# Patient Record
Sex: Female | Born: 1985
Health system: Southern US, Community
[De-identification: ages and names within clinical notes are randomized; demographics above are authoritative.]

## PROBLEM LIST (undated history)

## (undated) DIAGNOSIS — R42 Dizziness and giddiness: Secondary | ICD-10-CM

## (undated) DIAGNOSIS — F419 Anxiety disorder, unspecified: Secondary | ICD-10-CM

## (undated) DIAGNOSIS — Z8759 Personal history of other complications of pregnancy, childbirth and the puerperium: Secondary | ICD-10-CM

## (undated) DIAGNOSIS — Z8619 Personal history of other infectious and parasitic diseases: Secondary | ICD-10-CM

## (undated) DIAGNOSIS — O321XX0 Maternal care for breech presentation, not applicable or unspecified: Secondary | ICD-10-CM

## (undated) DIAGNOSIS — K219 Gastro-esophageal reflux disease without esophagitis: Secondary | ICD-10-CM

## (undated) DIAGNOSIS — I10 Essential (primary) hypertension: Secondary | ICD-10-CM

## (undated) DIAGNOSIS — R000 Tachycardia, unspecified: Secondary | ICD-10-CM

## (undated) HISTORY — DX: Tachycardia, unspecified: R00.0

## (undated) HISTORY — DX: Dizziness and giddiness: R42

## (undated) HISTORY — DX: Anxiety disorder, unspecified: F41.9

## (undated) HISTORY — DX: Personal history of other complications of pregnancy, childbirth and the puerperium: Z87.59

## (undated) HISTORY — DX: Personal history of other infectious and parasitic diseases: Z86.19

## (undated) HISTORY — PX: TONSILLECTOMY: SUR1361

## (undated) HISTORY — DX: Essential (primary) hypertension: I10

---
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
## 2009-04-30 ENCOUNTER — Ambulatory Visit: Payer: Self-pay | Admitting: Family Medicine

## 2009-04-30 DIAGNOSIS — J209 Acute bronchitis, unspecified: Secondary | ICD-10-CM

## 2009-07-17 ENCOUNTER — Ambulatory Visit: Payer: Self-pay | Admitting: Family Medicine

## 2009-07-17 DIAGNOSIS — S40019A Contusion of unspecified shoulder, initial encounter: Secondary | ICD-10-CM

## 2010-01-09 ENCOUNTER — Ambulatory Visit: Payer: Self-pay | Admitting: Family Medicine

## 2010-01-09 DIAGNOSIS — M542 Cervicalgia: Secondary | ICD-10-CM | POA: Insufficient documentation

## 2010-01-10 ENCOUNTER — Encounter: Payer: Self-pay | Admitting: Family Medicine

## 2010-02-19 ENCOUNTER — Ambulatory Visit: Payer: Self-pay | Admitting: Family Medicine

## 2010-02-19 LAB — CONVERTED CEMR LAB: Rapid Strep: NEGATIVE

## 2010-02-20 ENCOUNTER — Encounter: Payer: Self-pay | Admitting: Family Medicine

## 2010-03-11 ENCOUNTER — Ambulatory Visit: Payer: Self-pay | Admitting: Family Medicine

## 2010-03-12 ENCOUNTER — Encounter: Payer: Self-pay | Admitting: Family Medicine

## 2010-03-12 ENCOUNTER — Telehealth (INDEPENDENT_AMBULATORY_CARE_PROVIDER_SITE_OTHER): Payer: Self-pay | Admitting: *Deleted

## 2010-03-13 ENCOUNTER — Ambulatory Visit: Payer: Self-pay | Admitting: Family Medicine

## 2010-04-04 ENCOUNTER — Other Ambulatory Visit: Admission: RE | Admit: 2010-04-04 | Discharge: 2010-04-04 | Payer: Self-pay | Admitting: Family Medicine

## 2010-04-04 ENCOUNTER — Ambulatory Visit: Payer: Self-pay | Admitting: Family Medicine

## 2010-07-11 ENCOUNTER — Emergency Department (HOSPITAL_COMMUNITY)
Admission: EM | Admit: 2010-07-11 | Discharge: 2010-07-11 | Payer: Self-pay | Source: Home / Self Care | Admitting: Emergency Medicine

## 2010-07-11 ENCOUNTER — Encounter: Payer: Self-pay | Admitting: Family Medicine

## 2010-07-11 LAB — BASIC METABOLIC PANEL
BUN: 10 mg/dL (ref 6–23)
Calcium: 9.8 mg/dL (ref 8.4–10.5)
GFR calc non Af Amer: >60 mL/min (ref >60)
Glucose, Bld: 116 mg/dL — ABNORMAL HIGH (ref 70–99)
Potassium: 3.9 mEq/L (ref 3.5–5.1)

## 2010-07-11 LAB — DIFFERENTIAL
Basophils Absolute: 0 10*3/uL (ref 0.0–0.1)
Basophils Relative: 0 % (ref 0–1)
Lymphocytes Relative: 29 % (ref 12–46)
Lymphs Abs: 2 10*3/uL (ref 0.7–4.0)
Monocytes Relative: 6 % (ref 3–12)

## 2010-07-11 LAB — CBC
HCT: 41.8 % (ref 36.0–46.0)
Hemoglobin: 14.2 g/dL (ref 12.0–15.0)
RBC: 4.82 MIL/uL (ref 3.87–5.11)
WBC: 7 10*3/uL (ref 4.0–10.5)

## 2010-07-11 LAB — POCT CARDIAC MARKERS
CKMB, poc: <1 ng/mL — ABNORMAL LOW (ref 1.0–8.0)
Myoglobin, poc: 25 ng/mL (ref 12–200)
Troponin i, poc: <0.05 ng/mL (ref 0.00–0.09)

## 2010-07-11 LAB — D-DIMER, QUANTITATIVE: D-Dimer, Quant: <0.22 ug/mL-FEU (ref 0.00–0.48)

## 2010-07-14 ENCOUNTER — Ambulatory Visit
Admission: RE | Admit: 2010-07-14 | Discharge: 2010-07-14 | Payer: Self-pay | Source: Home / Self Care | Attending: Family Medicine | Admitting: Family Medicine

## 2010-07-14 ENCOUNTER — Encounter: Payer: Self-pay | Admitting: Family Medicine

## 2010-07-14 DIAGNOSIS — R002 Palpitations: Secondary | ICD-10-CM | POA: Insufficient documentation

## 2010-07-15 NOTE — Assessment & Plan Note (Signed)
Summary: NOV: f/u bronchitis   Vital Signs:  Patient profile:   25 year old female Height:      61.5 inches Weight:      139 pounds Pulse rate:   89 / minute BP sitting:   125 / 70  (right arm) Cuff size:   regular  Vitals Entered By: Avon Gully CMA, Duncan Dull) (March 13, 2010 9:23 AM) CC: NP est care   CC:  NP est care.  History of Present Illness: Was sick since Tuesday and dx with bronchitis.  This is the 3rd year in a row with bornchitis. Always in teh fall. No Sxs of SOB, or wheezing as a child. Does have spring allergies.  Family hx of asthma.  She is feeling better on the zpack and the mucinex.  Dr. Cathren Harsh heard some wheezing on exam.   Habits & Providers  Alcohol-Tobacco-Diet     Alcohol drinks/day: <1     Tobacco Status: never  Exercise-Depression-Behavior     Does Patient Exercise: yes     Type of exercise: running     Exercise (avg: min/session): 30-60     Times/week: 3     STD Risk: never     Drug Use: never     Seat Belt Use: always  Current Medications (verified): 1)  Ortho Tri-Cyclen (28) 0.18/0.215/0.25 Mg-35 Mcg Tabs (Norgestim-Eth Estrad Triphasic) 2)  Azithromycin 250 Mg Tabs (Azithromycin) .... Two Tabs By Mouth On Day 1, Then 1 Tab Daily On Days 2 Through 5 3)  Benzonatate 200 Mg Caps (Benzonatate) .... One By Mouth Hs As Needed Cough  Allergies (verified): 1)  ! Codeine Phosphate (Codeine Phosphate)  Comments:  Nurse/Medical Assistant: The patient's medications and allergies were reviewed with the patient and were updated in the Medication and Allergy Lists. Avon Gully CMA, Duncan Dull) (March 13, 2010 9:23 AM)  Past History:  Past Medical History: Last updated: 03/11/2010 Unremarkable Bronchitis annually for past 4 years  Past Surgical History: Tonsillectomy Tympanostomy tubes.   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 No kids.  Never  Smoked Alcohol use-yes 1-2/week Regular exercise-yes Drug use-no 2 caffeinated drink per week.  STD Risk:  never Drug Use:  never Seat Belt Use:  always  Review of Systems       No fever/sweats/weakness, unexplained weight loss/gain.  No vison changes.  No difficulty hearing/ringing in ears, hay fever/allergies.  No chest pain/discomfort, palpitations.  No Br lump/nipple discharge.  No cough/wheeze.  No blood in BM, nausea/vomiting/diarrhea.  No nighttime urination, leaking urine, unusual vaginal bleeding, discharge (penis or vagina).  No muscle/joint pain. No rash, change in mole.  No HA, memory loss.  No anxiety, sleep d/o, depression.  No easy bruising/bleeding, unexplained lump   Physical Exam  General:  Well-developed,well-nourished,in no acute distress; alert,appropriate and cooperative throughout examination Head:  Normocephalic and atraumatic without obvious abnormalities. No apparent alopecia or balding. Eyes:  No corneal or conjunctival inflammation noted. EOMI. Perrla.  Ears:  External ear exam shows no significant lesions or deformities.  Otoscopic examination reveals clear canals, tympanic membranes are intact bilaterally without bulging, retraction, inflammation or discharge. Hearing is grossly normal bilaterally. Nose:  External nasal examination shows no deformity or inflammation. Mouth:  Oral mucosa and oropharynx without lesions or exudates.  Teeth in good repair. Neck:  No deformities, masses, or tenderness noted. Lungs:  Normal respiratory effort, chest expands symmetrically. Lungs are clear to auscultation, no  crackles or wheezes. Heart:  Normal rate and regular rhythm. S1 and S2 normal without gallop, murmur, click, rub or other extra sounds. Skin:  no rashes.   Cervical Nodes:  No lymphadenopathy noted Psych:  Cognition and judgment appear intact. Alert and cooperative with normal attention span and concentration. No apparent delusions, illusions,  hallucinations   Impression & Recommendations:  Problem # 1:  ACUTE BRONCHITIS (ICD-466.0) Assessment Improved Doing well , Much improved. still a dry cough. Complete ABX. Also consider testing for asthma. Can schedule spirometry when is over this acute illness. She is planning on getting her flu shot through work.  Her updated medication list for this problem includes:    Azithromycin 250 Mg Tabs (Azithromycin) .Marland Kitchen..Marland Kitchen Two tabs by mouth on day 1, then 1 tab daily on days 2 through 5    Benzonatate 200 Mg Caps (Benzonatate) ..... One by mouth hs as needed cough  Complete Medication List: 1)  Ortho Tri-cyclen (28) 0.18/0.215/0.25 Mg-35 Mcg Tabs (Norgestim-eth estrad triphasic) 2)  Azithromycin 250 Mg Tabs (Azithromycin) .... Two tabs by mouth on day 1, then 1 tab daily on days 2 through 5 3)  Benzonatate 200 Mg Caps (Benzonatate) .... One by mouth hs as needed cough  Other Orders: Admin 1st Vaccine (16109) Tdap => 24yrs IM (60454)   Admin 1st Vaccine (09811) Flu Vaccine 69yrs + (91478)  Patient Instructions: 1)  You were given your Tdap today. 2)  Can schedule spirometry anytime to test for asthma   Contraindications/Deferment of Procedures/Staging:    Test/Procedure: FLU VAX    Reason for deferment: patient declined     Immunizations Administered:  Tetanus Vaccine:    Vaccine Type: Tdap    Site: left deltoid    Mfr: GlaxoSmithKline    Dose: 0.5 ml    Route: IM    Given by: Sue Lush McCrimmon CMA, (AAMA)    Exp. Date: 04/03/2012    Lot #: [id number]    VIS given: 05/02/08 version given March 13, 2010.

## 2010-07-15 NOTE — Assessment & Plan Note (Signed)
Summary: PAP ONLY   Vital Signs:  Patient profile:   25 year old female Height:      61.5 inches Weight:      139 pounds Pulse rate:   85 / minute BP sitting:   132 / 93  (right arm) Cuff size:   regular  Vitals Entered By: Avon Gully CMA, Duncan Dull) (April 04, 2010 8:24 AM) CC: pap only   CC:  pap only.  History of Present Illness: Her for pap only. Her periods are very regular on OCPs.This time she had spotting in  mid-cycle which is unusual for her. on the same OCP for 7 yeas and not chnage in the color or shape or manufacturer of the pill. Denies any pelvic or abodominal pain or dischare. She is sexually active.   Current Medications (verified): 1)  Ortho Tri-Cyclen (28) 0.18/0.215/0.25 Mg-35 Mcg Tabs (Norgestim-Eth Estrad Triphasic)  Allergies (verified): 1)  ! Codeine Phosphate (Codeine Phosphate)  Comments:  Nurse/Medical Assistant: The patient's medications and allergies were reviewed with the patient and were updated in the Medication and Allergy Lists. Avon Gully CMA, Duncan Dull) (April 04, 2010 8:25 AM)  Physical Exam  Genitalia:  Normal introitus for age, no external lesions, no vaginal discharge, mucosa pink and moist, no vaginal or cervical lesions, no vaginal atrophy, no friaility or hemorrhage, normal uterus size and position, no adnexal masses or tenderness   Impression & Recommendations:  Problem # 1:  SCREENING FOR MALIGNANT NEOPLASM OF THE CERVIX (ICD-V76.2) Pap sent. STD testing done for GC and chlam as this can cause abnormal bleeding. Vaginitis screen added.If neg then continue pill into next cycle and see if occurs again. Also consider pregnancy but possiblity is very low/   Complete Medication List: 1)  Ortho Tri-cyclen (28) 0.18/0.215/0.25 Mg-35 Mcg Tabs (Norgestim-eth estrad triphasic)   Orders Added: 1)  Est. Patient Level III [54098]

## 2010-07-15 NOTE — Assessment & Plan Note (Signed)
Summary: SORE THROAT/TIRED/HEADACHES   Vital Signs:  Patient Profile:   25 Years Old Female CC:      Swollen throat, headaches, tired x 3 days Height:     61.5 inches Weight:      138 pounds O2 Sat:      100 % O2 treatment:    Room Air Temp:     98.9 degrees F oral Pulse rate:   76 / minute Pulse rhythm:   regular Resp:     12 per minute BP sitting:   141 / 92  (right arm) Cuff size:   regular  Vitals Entered By: Emilio Math (January 09, 2010 8:18 AM)                  Current Allergies (reviewed today): ! CODEINE PHOSPHATE (CODEINE PHOSPHATE)History of Present Illness Chief Complaint: Swollen throat, headaches, tired x 3 days History of Present Illness:  Subjective: Patient complains of vague soreness in neck when swallowing for about 3 days, but not sore in her throat.  She has also been fatigued. No cough No pleuritic pain No wheezing No nasal congestion No post-nasal drainage No sinus pain/pressure No itchy/red eyes No earache No hemoptysis No SOB No fever/chills No nausea No vomiting No abdominal pain No diarrhea No skin rashes No myalgias No headache    REVIEW OF SYSTEMS Constitutional Symptoms      Denies fever, chills, night sweats, weight loss, weight gain, and fatigue.  Eyes       Denies change in vision, eye pain, eye discharge, glasses, contact lenses, and eye surgery. Ear/Nose/Throat/Mouth       Denies hearing loss/aids, change in hearing, ear pain, ear discharge, dizziness, frequent runny nose, frequent nose bleeds, sinus problems, sore throat, hoarseness, and tooth pain or bleeding.      Comments: Swollen throat Respiratory       Denies dry cough, productive cough, wheezing, shortness of breath, asthma, bronchitis, and emphysema/COPD.  Cardiovascular       Denies murmurs, chest pain, and tires easily with exhertion.    Gastrointestinal       Denies stomach pain, nausea/vomiting, diarrhea, constipation, blood in bowel movements, and  indigestion. Genitourniary       Denies painful urination, kidney stones, and loss of urinary control. Neurological       Complains of headaches.      Denies paralysis, seizures, and fainting/blackouts. Musculoskeletal       Denies muscle pain, joint pain, joint stiffness, decreased range of motion, redness, swelling, muscle weakness, and gout.  Skin       Denies bruising, unusual mles/lumps or sores, and hair/skin or nail changes.  Psych       Denies mood changes, temper/anger issues, anxiety/stress, speech problems, depression, and sleep problems.  Past History:  Past Medical History: Reviewed history from 04/30/2009 and no changes required. Unremarkable  Past Surgical History: Reviewed history from 04/30/2009 and no changes required. Tonsillectomy  Family History: Reviewed history from 04/30/2009 and no changes required. Mother : healthy Father : healthy  Social History: Reviewed history from 07/17/2009 and no changes required. 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  Extraocular movement is intact.  Conjunctivae are not inflamed.  Ears:  Canals normal.  Tympanic membranes normal.   Nose:  No congestion; no sinus tenderness Pharynx:  Normal  Neck:  Supple.  No adenopathy is present.  No thyromegaly is present  Lungs:  Clear to auscultation.  Breath sounds are equal.  Heart:  Regular rate and rhythm without murmurs, rubs, or gallops.  Abdomen:  Nontender without masses or hepatosplenomegaly.  Bowel sounds are present.  No CVA or flank tenderness.  Rapid strep test negative  CBC:  Normal; WBC 6.1, Hgb 13.8, Normal diff Assessment New Problems: CERVICALGIA (ICD-723.1)  SUSPECT A VIRAL PRODROME SYMPTOM  Plan New Orders: Est. Patient Level III [99213] CBC w/Diff  [16109-60454] T-Culture, Throat [09811-91478] Planning Comments:   Throat culture pending Treat symptomatically for now:  Ibuprofen, echinacea     Diagnoses and expected course of recovery discussed and will return if not improved as expected or if the condition worsens. Patient and/or caregiver verbalized understanding.   Orders Added: 1)  Est. Patient Level III [29562] 2)  CBC w/Diff [13086-57846] 3)  T-Culture, Throat [96295-28413]

## 2010-07-15 NOTE — Assessment & Plan Note (Signed)
Summary: SHOULDER INJURY/KH   Vital Signs:  Patient Profile:   25 Years Old Female CC:      left shoulder pain Height:     61.5 inches Weight:      142 pounds O2 Sat:      100 % O2 treatment:    Room Air Temp:     97.5 degrees F oral Pulse rate:   84 / minute Pulse rhythm:   regular Resp:     18 per minute BP sitting:   146 / 89  (right arm)  Pt. in pain?   yes    Location:   left shoulder    Intensity:   6    Type:       sharp  Vitals Entered By: Lita Mains, RN                   Prior Medication List:  ORTHO TRI-CYCLEN (28) 0.18/0.215/0.25 MG-35 MCG TABS (NORGESTIM-ETH ESTRAD TRIPHASIC)  CEFDINIR 300 MG CAPS (CEFDINIR) 1 by mouth q12hr PREDNISONE 10 MG TABS (PREDNISONE) 2 PO BID for 2 days, then 1 BID for 2 days, then 1 daily for 2 days.  Take PC BENZONATATE 200 MG CAPS (BENZONATATE) One by mouth hs as needed cough   Updated Prior Medication List: ORTHO TRI-CYCLEN (28) 0.18/0.215/0.25 MG-35 MCG TABS (NORGESTIM-ETH ESTRAD TRIPHASIC)   Current Allergies (reviewed today): ! CODEINE PHOSPHATE (CODEINE PHOSPHATE) History of Present Illness History from: patient Chief Complaint: left shoulder pain History of Present Illness: Patient states that on Saturday 07/13/09 she was painting and tripped over some blankets on the floor falling into the doorframe falling with all of her weight into her left shoulder. The has limited ROM but no swelling. She c/o 6/10 sharp pain at rest and 9/10 with activitty. She has positive sensation and denies tingling in the left extremity.  Current Meds ORTHO TRI-CYCLEN (28) 0.18/0.215/0.25 MG-35 MCG TABS (NORGESTIM-ETH ESTRAD TRIPHASIC)  MOBIC 7.5 MG TABS (MELOXICAM) sig 1 by mouth q day do not take w/ motrin HYDROCODONE-ACETAMINOPHEN 5-325 MG TABS (HYDROCODONE-ACETAMINOPHEN) sig 1 by mouth q 6-8hrs as needed for break throuh pain  REVIEW OF SYSTEMS Constitutional Symptoms      Denies fever, chills, night sweats, weight loss, weight gain,  and fatigue.  Eyes       Denies change in vision, eye pain, eye discharge, glasses, contact lenses, and eye surgery. Ear/Nose/Throat/Mouth       Denies hearing loss/aids, change in hearing, ear pain, ear discharge, dizziness, frequent runny nose, frequent nose bleeds, sinus problems, sore throat, hoarseness, and tooth pain or bleeding.  Respiratory       Denies dry cough, productive cough, wheezing, shortness of breath, asthma, bronchitis, and emphysema/COPD.  Cardiovascular       Denies murmurs, chest pain, and tires easily with exhertion.    Gastrointestinal       Denies stomach pain, nausea/vomiting, diarrhea, constipation, blood in bowel movements, and indigestion. Genitourniary       Denies painful urination, kidney stones, and loss of urinary control. Neurological       Denies paralysis, seizures, and fainting/blackouts. Musculoskeletal       Complains of muscle pain, joint pain, and decreased range of motion.      Denies joint stiffness, redness, swelling, muscle weakness, and gout.      Comments: left shoulder Skin       Denies bruising, unusual mles/lumps or sores, and hair/skin or nail changes.  Psych       Denies  mood changes, temper/anger issues, anxiety/stress, speech problems, depression, and sleep problems.  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 Unremarkable  Past Surgical History: Reviewed history from 04/30/2009 and no changes required. Tonsillectomy  Family History: Reviewed history from 04/30/2009 and no changes required. Mother : healthy Father : healthy  Social History: Reviewed history from 04/30/2009  and no changes required. 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 Alcohol use-yes Regular exercise-yes Physical Exam General appearance: well developed, well nourished, mild to moderate distress Head: normocephalic, atraumatic Extremities: Tenderness over the anterior L shoulder  abl to raise her arm slightly above her head Skin: no obvious rashes or lesions MSE: oriented to time, place, and person no tenderness over the scapula area Assessment New Problems: CONTUSION OF SHOULDER REGION (ICD-923.00) MYALGIA/MYOSITIS (ICD-729.1) MUSCULOSKELETAL PAIN (ICD-781.99)  shoulder contusion  Patient Education: Patient and/or caregiver instructed in the following: rest, rest fluids and Tylenol.  Plan New Medications/Changes: HYDROCODONE-ACETAMINOPHEN 5-325 MG TABS (HYDROCODONE-ACETAMINOPHEN) sig 1 by mouth q 6-8hrs as needed for break throuh pain  #16 x 0, 07/17/2009, Hassan Rowan MD MOBIC 7.5 MG TABS (MELOXICAM) sig 1 by mouth q day do not take w/ motrin  #30 x 0, 07/17/2009, Hassan Rowan MD  New Orders: Est. Patient Level III [02725] T-DG Shoulder*L* [73030] Shoulder Immobilizer any size [L3670] Follow Up: Follow up in 2-3 days if no improvement, Follow up with Primary Physician  The patient and/or caregiver has been counseled thoroughly with regard to medications prescribed including dosage, schedule, interactions, rationale for use, and possible side effects and they verbalize understanding.  Diagnoses and expected course of recovery discussed and will return if not improved as expected or if the condition worsens. Patient and/or caregiver verbalized understanding.  Prescriptions: HYDROCODONE-ACETAMINOPHEN 5-325 MG TABS (HYDROCODONE-ACETAMINOPHEN) sig 1 by mouth q 6-8hrs as needed for break throuh pain  #16 x 0   Entered and Authorized by:   Hassan Rowan MD   Signed by:   Hassan Rowan MD on 07/17/2009   Method used:   Print then Give to Patient   RxID:   [id number] MOBIC 7.5 MG TABS  (MELOXICAM) sig 1 by mouth q day do not take w/ motrin  #30 x 0   Entered and Authorized by:   Hassan Rowan MD   Signed by:   Hassan Rowan MD on 07/17/2009   Method used:   Print then Give to Patient   RxID:   [id number]   Patient Instructions: 1)  Please schedule a follow-up appointment as needed. 2)  Please schedule an appointment with your primary doctor in : 3)  Recommended remaining out of work for next 24 hours not scheduled until 36 hours 4)  Use sling as needed

## 2010-07-15 NOTE — Assessment & Plan Note (Signed)
Summary: Fever, sorethroat, HA x 3 dys rm 5   Vital Signs:  Patient Profile:   25 Years Old Female CC:      Cold & URI symptoms Height:     61.5 inches Weight:      139 pounds O2 Sat:      98 % O2 treatment:    Room Air Temp:     99.2 degrees F oral Pulse rate:   88 / minute Pulse rhythm:   regular Resp:     16 per minute BP sitting:   126 / 87  (left arm) Cuff size:   large  Vitals Entered By: Areta Haber CMA (February 19, 2010 6:05 PM)                  Current Allergies: ! CODEINE PHOSPHATE (CODEINE PHOSPHATE)      History of Present Illness Chief Complaint: Cold & URI symptoms History of Present Illness:  Subjective: Patient complains of sore throat for 3 days. No cough No pleuritic pain No wheezing Minimal nasal congestion ? post-nasal drainage No sinus pain/pressure No itchy/red eyes No earache No hemoptysis No SOB No fever/chills, + sweats No nausea No vomiting No abdominal pain No diarrhea No skin rashes + fatigue + myalgias No headache Used OTC meds without relief   Current Problems: URI (ICD-465.9) CERVICALGIA (ICD-723.1) CONTUSION OF SHOULDER REGION (ICD-923.00) ACUTE BRONCHITIS (ICD-466.0)   Current Meds ORTHO TRI-CYCLEN (28) 0.18/0.215/0.25 MG-35 MCG TABS (NORGESTIM-ETH ESTRAD TRIPHASIC)  ADVIL 200 MG TABS (IBUPROFEN) 800 mgs ADVIL COLD & SINUS LIQUI-GELS 30-200 MG CAPS (PSEUDOEPHEDRINE-IBUPROFEN) as directed PREDNISONE 10 MG TABS (PREDNISONE) 2 PO today, then 2  BID for 2 days, then 1 two times a day for 2 days, then 1 daily for 2 days.  Take PC  REVIEW OF SYSTEMS Constitutional Symptoms       Complains of fever and fatigue.     Denies chills, night sweats, weight loss, and weight gain.  Eyes       Denies change in vision, eye pain, eye discharge, glasses, contact lenses, and eye surgery. Ear/Nose/Throat/Mouth       Complains of sore throat.      Denies hearing loss/aids, change in hearing, ear pain, ear discharge,  dizziness, frequent runny nose, frequent nose bleeds, sinus problems, hoarseness, and tooth pain or bleeding.      Comments: x 3 dys Respiratory       Denies dry cough, productive cough, wheezing, shortness of breath, asthma, bronchitis, and emphysema/COPD.  Cardiovascular       Denies murmurs, chest pain, and tires easily with exhertion.    Gastrointestinal       Denies stomach pain, nausea/vomiting, diarrhea, constipation, blood in bowel movements, and indigestion. Genitourniary       Denies painful urination, kidney stones, and loss of urinary control. Neurological       Complains of headaches.      Denies paralysis, seizures, and fainting/blackouts. Musculoskeletal       Complains of muscle pain and joint pain.      Denies joint stiffness, decreased range of motion, redness, swelling, muscle weakness, and gout.  Skin       Denies bruising, unusual mles/lumps or sores, and hair/skin or nail changes.  Psych       Denies mood changes, temper/anger issues, anxiety/stress, speech problems, depression, and sleep problems.  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  Extraocular movement is intact.  Conjunctivae are not inflamed.  Ears:  Canals normal.  Tympanic membranes normal.   Nose:  Normal septum.  Normal turbinates, mildly congested.   No sinus tenderness present.  Pharynx:  Mildly erythematous Neck:  Supple.  Tender anterior  nodes Lungs:  Clear to auscultation.  Breath sounds are equal.  Heart:  Regular rate and rhythm without murmurs, rubs, or gallops.  Abdomen:  Nontender without masses or hepatosplenomegaly.  Bowel sounds are present.  No CVA or flank tenderness.  Skin:  No rash Rapid strep test negative  Assessment New Problems: URI (ICD-465.9)  SUSPECT EARLY VIRAL URI   Plan New Medications/Changes: PREDNISONE 10 MG TABS (PREDNISONE) 2 PO today, then 2  BID for 2 days, then 1 two times a day for 2 days, then 1 daily for 2 days.  Take PC  #16 x 0, 02/19/2010, Donna Christen MD  New Orders: Rapid Strep [16109] Est. Patient Level III [99213] T-Culture, Throat [[id number]] Planning Comments:   Throat culture pending.  For now begin tapering course of prednisone.  Begin expectorant/decongestant if sinus congestion and cough develop.  Cough suppressant at bedtime. Follow-up with PCP if not improving.   The patient and/or caregiver has been counseled thoroughly with regard to medications prescribed including dosage, schedule, interactions, rationale for use, and possible side effects and they verbalize understanding.  Diagnoses and expected course of recovery discussed and will return if not improved as expected or if the condition worsens. Patient and/or caregiver verbalized understanding.  Prescriptions: PREDNISONE 10 MG TABS (PREDNISONE) 2 PO today, then 2  BID for 2 days, then 1 two times a day for 2 days, then 1 daily for 2 days.  Take PC  #16 x 0   Entered and Authorized by:   Donna Christen MD   Signed by:   Donna Christen MD on 02/19/2010   Method used:   Print then Give to Patient   RxID:   [id number]   Patient Instructions: 1)  If sinus congestion and cough develop, may use Mucinex D (guaifenesin with decongestant) twice daily with plenty of fluids. 2)   May use Afrin nasal spray (or generic oxymetazoline) twice daily for about 5 days for persistent sinus congestion.   Also recommend  using saline nasal spray several times daily and/or saline nasal irrigation. 3)  If cough develops at night, may take Delsym Cough Suppressant at bedtime. 4)  Followup with family doctor if not improving 7 to 10 days.  Orders Added: 1)  Rapid Strep [[id number]] 2)  Est. Patient Level III [78469] 3)  T-Culture, Throat [[id number]]  Laboratory Results  Date/Time Received: February 19, 2010 6:07 PM  Date/Time Reported: February 19, 2010 6:07 PM   Other Tests  Rapid Strep: negative  Kit Test Internal QC: Negative   (Normal Range: Negative)

## 2010-07-15 NOTE — Letter (Signed)
Summary: CONTROLLED MED PRESCRIPTION POLICY  CONTROLLED MED PRESCRIPTION POLICY   Imported By: Dannette Barbara 03/11/2010 14:02:42  _____________________________________________________________________  External Attachment:    Type:   Image     Comment:   External Document

## 2010-07-15 NOTE — Assessment & Plan Note (Signed)
Summary: POSSIBLE BROCHITIS/FEVER/SOB (5)   Vital Signs:  Patient Profile:   25 Years Old Female CC:      prosuctive cough, chest tightness, SOB, x 2 days Height:     61.5 inches Weight:      139 pounds O2 Sat:      100 % O2 treatment:    Room Air Temp:     98.9 degrees F oral Pulse rate:   92 / minute Resp:     14 per minute BP sitting:   145 / 92  (left arm) Cuff size:   regular  Vitals Entered By: Lajean Saver RN (March 11, 2010 8:27 AM)                  Updated Prior Medication List: ORTHO TRI-CYCLEN (28) 0.18/0.215/0.25 MG-35 MCG TABS (NORGESTIM-ETH ESTRAD TRIPHASIC)   Current Allergies (reviewed today): ! CODEINE PHOSPHATE (CODEINE PHOSPHATE)History of Present Illness Chief Complaint: prosuctive cough, chest tightness, SOB, x 2 days History of Present Illness:  Subjective: Patient complains of non-productive cough for 2 days with sensation of chest tightness. No sore throat No pleuritic pain No wheezing No nasal congestion ? post-nasal drainage No sinus pain/pressure No itchy/red eyes No earache No hemoptysis No SOB Low grade fever/chills No nausea No vomiting No abdominal pain No diarrhea No skin rashes + fatigue No myalgias + mild headache    REVIEW OF SYSTEMS Constitutional Symptoms       Complains of fever and fatigue.     Denies chills, night sweats, weight loss, and weight gain.  Eyes       Denies change in vision, eye pain, eye discharge, glasses, contact lenses, and eye surgery. Ear/Nose/Throat/Mouth       Complains of frequent runny nose and hoarseness.      Denies hearing loss/aids, change in hearing, ear pain, ear discharge, dizziness, frequent nose bleeds, sinus problems, sore throat, and tooth pain or bleeding.  Respiratory       Complains of productive cough, wheezing, and shortness of breath.      Denies dry cough, asthma, bronchitis, and emphysema/COPD.  Cardiovascular       Complains of chest pain.      Denies murmurs and  tires easily with exhertion.      Comments: tight   Gastrointestinal       Denies stomach pain, nausea/vomiting, diarrhea, constipation, blood in bowel movements, and indigestion. Genitourniary       Denies painful urination, kidney stones, and loss of urinary control. Neurological       Complains of headaches.      Denies paralysis, seizures, and fainting/blackouts. Musculoskeletal       Denies muscle pain, joint pain, joint stiffness, decreased range of motion, redness, swelling, muscle weakness, and gout.  Skin       Denies bruising, unusual mles/lumps or sores, and hair/skin or nail changes.  Psych       Denies mood changes, temper/anger issues, anxiety/stress, speech problems, depression, and sleep problems. Other Comments: symptoms started 2 days ago. Biggest complaint is chest tightness and SOB. Low grade fever.   Past History:  Past Medical History: Unremarkable Bronchitis annually for past 4 years  Past Surgical History: Reviewed history from 04/30/2009 and no changes required. 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  Extraocular movement is intact.  Conjunctivae are not inflamed.  Ears:  Canals normal.  Tympanic membranes normal.   Nose:  Normal septum.  Normal turbinates, mildly congested.   No sinus tenderness present.  Pharynx:  Normal  Neck:  Supple.  No adenopathy is present.  No thyromegaly is present  Lungs:  Clear to auscultation.  Breath sounds are equal.  No sternal tenderness Heart:  Regular rate and rhythm without murmurs, rubs, or gallops.  Abdomen:  Nontender without masses or hepatosplenomegaly.  Bowel sounds are present.  No CVA or flank  tenderness.  Extremities:  No edema.  Skin:  No rash CBC:  WBC 12.4, Hgb 13.0 Assessment New Problems: ACUTE BRONCHITIS (ICD-466.0)   Plan New Medications/Changes: BENZONATATE 200 MG CAPS (BENZONATATE) One by mouth hs as needed cough  #12 x 0, 03/11/2010, Donna Christen MD AZITHROMYCIN 250 MG TABS (AZITHROMYCIN) Two tabs by mouth on day 1, then 1 tab daily on days 2 through 5  #6 tabs x 0, 03/11/2010, Donna Christen MD  New Orders: Est. Patient Level III [16109] CBC w/Diff [[id number]] Planning Comments:   Begin Z-pack, expectorant daytime, cough suppressant at night.  Increase fluids. Follow-up with PCP if not improving.   The patient and/or caregiver has been counseled thoroughly with regard to medications prescribed including dosage, schedule, interactions, rationale for use, and possible side effects and they verbalize understanding.  Diagnoses and expected course of recovery discussed and will return if not improved as expected or if the condition worsens. Patient and/or caregiver verbalized understanding.  Prescriptions: BENZONATATE 200 MG CAPS (BENZONATATE) One by mouth hs as needed cough  #12 x 0   Entered and Authorized by:   Donna Christen MD   Signed by:   Donna Christen MD on 03/11/2010   Method used:   Print then Give to Patient   RxID:   [id number] AZITHROMYCIN 250 MG TABS (AZITHROMYCIN) Two tabs by mouth on day 1, then 1 tab daily on days 2 through 5  #6 tabs x 0   Entered and Authorized by:   Donna Christen MD   Signed by:   Donna Christen MD on 03/11/2010   Method used:   Print then Give to Patient   RxID:   [id number]   Patient Instructions: 1)  Use plain Mucinex daytime with plenty of fluids for cough. 2)  Follow-up with family doctor if not improved one week.  Orders Added: 1)  Est. Patient Level III [40102] 2)  CBC w/Diff [[id number]]

## 2010-07-15 NOTE — Progress Notes (Signed)
  Phone Note Outgoing Call Call back at [facility] Phone [phone]   Action Taken: Phone Call Completed Summary of Call: Left msg, hope she is feeling better if not please follow up with Dr Linford Arnold

## 2010-07-17 ENCOUNTER — Telehealth: Payer: Self-pay | Admitting: Family Medicine

## 2010-07-17 DIAGNOSIS — N949 Unspecified condition associated with female genital organs and menstrual cycle: Secondary | ICD-10-CM | POA: Insufficient documentation

## 2010-07-18 ENCOUNTER — Encounter: Payer: Self-pay | Admitting: Family Medicine

## 2010-07-23 NOTE — Progress Notes (Signed)
Summary: serum preg test  Phone Note Call from Patient   Caller: Patient Call For: Nani Gasser MD Summary of Call: pt would like to do a serum preg test because her BCP has been recalled. can you please put in an order Initial call taken by: Avon Gully CMA, Duncan Dull),  July 17, 2010 2:19 PM  New Problems: DELAYED MENSES (ICD-626.8)   New Problems: DELAYED MENSES (ICD-626.8)

## 2010-07-23 NOTE — Progress Notes (Signed)
Summary: [facility] recall  Phone Note Call from Patient Call back at Home Phone [phone]   Caller: Patient Call For: Nani Gasser MD Summary of Call: pt states her OCP was recalled.  Pt was seen by Dr. Linford Arnold on 1/30.  Had neg UPT in ER, but would like SPT. RC to pt.  LM to RC at home number. Initial call taken by: Francee Piccolo CMA Duncan Dull),  July 17, 2010 1:44 PM  Follow-up for Phone Call        [facility] from pt.  LMP about 3 weeks ago.  Would like SPT in lab downstairs.  Pt still having nausea. Follow-up by: Francee Piccolo CMA Duncan Dull),  July 17, 2010 1:52 PM

## 2010-07-23 NOTE — Assessment & Plan Note (Signed)
Summary: [facility] ER visit fu   Vital Signs:  Patient profile:   25 year old female Height:      61.5 inches Weight:      143 pounds Pulse rate:   77 / minute BP sitting:   141 / 89  (right arm) Cuff size:   regular  Vitals Entered By: Avon Gully CMA, Duncan Dull) (July 14, 2010 3:35 PM) CC: F/u ED visit, felt sob and heart started racing almost passed out at work friday   CC:  F/u ED visit and felt sob and heart started racing almost passed out at work friday.  History of Present Illness: F/u ED visit, felt sob and heart started racing almost passed out at work friday.  As was getting ready for work felt like couldn't get a deep breath in. Denies worrying or feeling stressed.  Felt lighthead on the way to work. Then at work heart was beating hard and flushed and could hear her heartbeating in her head.  Sat down and a co-worker checked her BP and it was SBP 180.  HR was 120-130s.  Then felt lightheaded and thought might pass out. Went to ED adn neg UPT, CXR and neg D-dimer. Then later that night had a simlar episodes that lasted about 10 minutes.  Home BP was in the 160s.  EKG was normal.  Neg orthostatics.  no family hx of thyroid problems. Has felt really tired over the weekend. This AM felt nauseated. LMP as was 2-3 weeks ago. Has had a mild HA.  No CP or pleuritc pain.   Current Medications (verified): 1)  Ortho Tri-Cyclen Lo 0.18/0.215/0.25 Mg-25 Mcg Tabs (Norgestim-Eth Estrad Triphasic) .... Take One Tablet By Mouth Once A Day  Allergies (verified): 1)  ! Codeine Phosphate (Codeine Phosphate)  Comments:  Nurse/Medical Assistant: The patient's medications and allergies were reviewed with the patient and were updated in the Medication and Allergy Lists. 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  Never Smoked Alcohol use-yes 1-2/week Regular exercise-yes Drug use-no 2 caffeinated drink per week.   Family History: Mother- htn and DM, MVP Asthma GM with MI  Physical Exam  General:  Well-developed,well-nourished,in no acute distress; alert,appropriate and cooperative throughout examination Head:  Normocephalic and atraumatic without obvious abnormalities. No apparent alopecia or balding. Eyes:  No corneal or conjunctival inflammation noted. EOMI. Perrla. Ears:  External ear exam shows no significant lesions or deformities.  Otoscopic examination reveals clear canals, tympanic membranes are intact bilaterally without bulging, retraction, inflammation or discharge. Hearing is grossly normal bilaterally. Nose:  External nasal examination shows no deformity or inflammation.  Mouth:  Oral mucosa and oropharynx without lesions or exudates.  Teeth in good repair. Mild cobblestoning in the post pharynx.  Neck:  No deformities, masses, or tenderness noted. Lungs:  Normal respiratory effort, chest expands symmetrically. Lungs are clear to auscultation, no crackles or wheezes. Heart:  Normal rate and regular rhythm. S1 and S2 normal without gallop, murmur, click, rub or other extra sounds. Abdomen:  Bowel sounds positive,abdomen soft and non-tender without masses, organomegaly or hernias noted. Pulses:  Radial 2+ bilat Neurologic:  Very brisk reflexes in teh UE and LE.  Skin:  no rashes.  Cervical Nodes:  No lymphadenopathy noted Psych:  Cognition and judgment appear intact. Alert and cooperative with normal attention span and concentration. No apparent delusions, illusions, hallucinations   Impression & Recommendations:  Problem # 1:  PALPITATIONS, OCCASIONAL (ICD-785.1) Ucnlear etiology. She was on telemetry while there so  this essentially rules out an arrythemia.  Per her labs no anemia, infection or electrolyte imbalance or dehdyration. her d-dimer is negative which mostly rules out PE.  Her EKG and telemetry were normal.  Her chest x-ray was normal and this rules out infection. We will check her thyroid to rule out hyperthyroid. She denies any recent weight loss Will set up for an echo as well for further evaluation.   consider other things like pheochromocytoma. did review the neck and labs from the emergency department. Orders: T-TSH [phone]) T-2-D Echo Complete ([id number])  Complete Medication List: 1)  Ortho Tri-cyclen Lo 0.18/0.215/0.25 Mg-25 Mcg Tabs (Norgestim-eth estrad triphasic) .... Take one tablet by mouth once a day  Patient Instructions: 1)  We will call you with the echo.   Orders Added: 1)  T-TSH [[id number]] 2)  T-2-D Echo Complete [93307] 3)  Est. Patient Level IV [40347]

## 2010-07-28 ENCOUNTER — Ambulatory Visit (HOSPITAL_COMMUNITY): Payer: 59 | Attending: Family Medicine

## 2010-08-06 NOTE — Letter (Signed)
Summary: [facility]   [facility]   Imported By: Kassie Mends 07/30/2010 10:07:28  _____________________________________________________________________  External Attachment:    Type:   Image     Comment:   External Document

## 2010-09-29 ENCOUNTER — Other Ambulatory Visit: Payer: Self-pay | Admitting: *Deleted

## 2010-09-29 MED ORDER — NORGESTIM-ETH ESTRAD TRIPHASIC 0.18/0.215/0.25 MG-25 MCG PO TABS: 1.0000 | ORAL_TABLET | ORAL | Status: DC

## 2011-03-18 LAB — OB RESULTS CONSOLE ABO/RH: RH Type: POSITIVE

## 2011-03-18 LAB — OB RESULTS CONSOLE RUBELLA ANTIBODY, IGM: Rubella: IMMUNE

## 2011-03-18 LAB — OB RESULTS CONSOLE HIV ANTIBODY (ROUTINE TESTING): HIV: NONREACTIVE

## 2011-03-18 LAB — OB RESULTS CONSOLE RPR: RPR: NONREACTIVE

## 2011-04-04 ENCOUNTER — Inpatient Hospital Stay (INDEPENDENT_AMBULATORY_CARE_PROVIDER_SITE_OTHER)
Admission: RE | Admit: 2011-04-04 | Discharge: 2011-04-04 | Disposition: A | Discharge status: Home / Self Care | Payer: 59 | Source: Ambulatory Visit | Attending: Emergency Medicine | Admitting: Emergency Medicine

## 2011-04-04 ENCOUNTER — Encounter: Payer: Self-pay | Admitting: Emergency Medicine

## 2011-04-04 DIAGNOSIS — J069 Acute upper respiratory infection, unspecified: Secondary | ICD-10-CM

## 2011-05-18 NOTE — Progress Notes (Signed)
Summary: SORE THROAT(RM5)   Vital Signs:  Patient Profile:   25 Years Old Female CC:      SORE THROAT Height:     61.5 inches Weight:      151.13 pounds O2 Sat:      100 % O2 treatment:    Room Air Temp:     98.4 degrees F oral Pulse rate:   92 / minute Resp:     20 per minute BP sitting:   129 / 89  (left arm) Cuff size:   regular  Vitals Entered By: Linton Flemings RN (April 04, 2011 1:54 PM)                  Updated Prior Medication List: PENATAL VITAMINS Current Allergies (reviewed today): ! CODEINE PHOSPHATE (CODEINE PHOSPHATE)History of Present Illness Chief Complaint: SORE THROAT History of Present Illness: 25 Years Old Female complains of onset of cold symptoms for 2 days.  She is [redacted] weeks pregnant with her first child. + sore throat No cough No pleuritic pain No wheezing No nasal congestion No post-nasal drainage No sinus pain/pressure No chest congestion No itchy/red eyes No earache No hemoptysis No SOB No chills/sweats No fever No nausea No vomiting No abdominal pain No diarrhea No skin rashes No fatigue No myalgias No headache   REVIEW OF SYSTEMS Constitutional Symptoms      Denies fever, chills, night sweats, weight loss, weight gain, and fatigue.  Eyes       Denies change in vision, eye pain, eye discharge, glasses, contact lenses, and eye surgery. Ear/Nose/Throat/Mouth       Complains of sore throat.      Denies hearing loss/aids, change in hearing, ear pain, ear discharge, dizziness, frequent runny nose, frequent nose bleeds, sinus problems, hoarseness, and tooth pain or bleeding.  Respiratory       Denies dry cough, productive cough, wheezing, shortness of breath, asthma, bronchitis, and emphysema/COPD.  Cardiovascular       Denies murmurs, chest pain, and tires easily with exhertion.    Gastrointestinal       Denies stomach pain, nausea/vomiting, diarrhea, constipation, blood in bowel movements, and indigestion. Genitourniary    Denies painful urination, kidney stones, and loss of urinary control. Neurological       Complains of headaches.      Denies paralysis, seizures, and fainting/blackouts. Musculoskeletal       Denies muscle pain, joint pain, joint stiffness, decreased range of motion, redness, swelling, muscle weakness, and gout.  Skin       Denies bruising, unusual mles/lumps or sores, and hair/skin or nail changes.  Psych       Denies mood changes, temper/anger issues, anxiety/stress, speech problems, depression, and sleep problems. Other Comments: SORE THROAT STARTED YESTERDAY   Past History:  Past Medical History: Reviewed history from 03/11/2010 and no changes required. Unremarkable Bronchitis annually for past 4 years  Past Surgical History: Reviewed history from 03/13/2010 and no changes required. Tonsillectomy Tympanostomy tubes.   Family History: Reviewed history from 07/14/2010 and no changes required. Mother- htn and DM, MVP Asthma GM with MI  Social History: Reviewed history from 03/13/2010 and no changes required. 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  Never Smoked Alcohol use-yes 1-2/week Regular exercise-yes Drug use-no 2 caffeinated drink per week.  Physical Exam General appearance: well developed, well nourished, no acute distress Ears: normal, no lesions or deformities Nasal: mucosa pink, nonedematous, no septal deviation, turbinates normal Oral/Pharynx: tongue normal, posterior pharynx  without erythema or exudate Chest/Lungs: no rales, wheezes, or rhonchi bilateral, breath sounds equal without effort Heart: regular rate and  rhythm, no murmur MSE: oriented to time, place, and person Assessment New Problems: UPPER RESPIRATORY INFECTION, ACUTE (ICD-465.9)   Plan New Orders: Est. Patient Level III [99213] T-Culture, Throat [[id number]] Rapid Strep [13086] Planning Comments:    No antibiotic given today.  Culture pending.  Rapid strep neg.   Discussed with her pregnancy class A, B, C, D, X and what meds are better.  However, I prefer zero medicines to be taken unless ok'd by OB since she is still in the prime time of organogenesis. Use nasal saline solution (over the counter) at least 3 times a day.  Call your OB on Monday to discuss.   The patient and/or caregiver has been counseled thoroughly with regard to medications prescribed including dosage, schedule, interactions, rationale for use, and possible side effects and they verbalize understanding.  Diagnoses and expected course of recovery discussed and will return if not improved as expected or if the condition worsens. Patient and/or caregiver verbalized understanding.   Orders Added: 1)  Est. Patient Level III [99213] 2)  T-Culture, Throat [[id number]] 3)  Rapid Strep [[id number]]    Laboratory Results  Date/Time Received: April 04, 2011 2:11 PM  Date/Time Reported: April 04, 2011 2:12 PM   Other Tests  Rapid Strep: negative  Kit Test Internal QC: Negative   (Normal Range: Negative)

## 2011-10-06 ENCOUNTER — Encounter (HOSPITAL_COMMUNITY): Payer: Self-pay | Admitting: Pharmacist

## 2011-10-07 ENCOUNTER — Encounter (HOSPITAL_COMMUNITY): Payer: Self-pay

## 2011-10-08 ENCOUNTER — Encounter (HOSPITAL_COMMUNITY): Payer: Self-pay

## 2011-10-08 ENCOUNTER — Encounter (HOSPITAL_COMMUNITY)
Admission: RE | Admit: 2011-10-08 | Discharge: 2011-10-08 | Disposition: A | Discharge status: Home / Self Care | Payer: 59 | Source: Ambulatory Visit | Attending: Obstetrics and Gynecology | Admitting: Obstetrics and Gynecology

## 2011-10-08 HISTORY — DX: Maternal care for breech presentation, not applicable or unspecified: O32.1XX0

## 2011-10-08 HISTORY — DX: Gastro-esophageal reflux disease without esophagitis: K21.9

## 2011-10-08 LAB — SURGICAL PCR SCREEN: Staphylococcus aureus: NEGATIVE

## 2011-10-08 NOTE — Patient Instructions (Addendum)
   Your procedure is scheduled on: Wednesday May 1st  Enter through the [employer] of [facility] at: 7:30am Pick up the phone at the desk and dial [phone] and inform us of your arrival.  Please call this number if you have any problems the morning of surgery: [phone]  Remember: Do not eat food after midnight: Tuesday Do not drink clear liquids after: midnight Tuesday Take these medicines the morning of surgery with a SIP OF WATER:zantac Do not wear jewelry, make-up, or FINGER nail polish Do not wear lotions, powders, perfumes or deodorant. Do not shave 48 hours prior to surgery. Do not bring valuables to the hospital. Contacts, dentures or bridgework may not be worn into surgery.  Leave suitcase in the car. After Surgery it may be brought to your room. For patients being admitted to the hospital, checkout time is 11:00am the day of discharge.   Remember to use your hibiclens as instructed.Please shower with 1/2 bottle the evening before your surgery and the other 1/2 bottle the morning of surgery. Neck down avoiding private area.

## 2011-10-09 ENCOUNTER — Other Ambulatory Visit: Payer: Self-pay | Admitting: Obstetrics and Gynecology

## 2011-10-13 MED ORDER — CEFAZOLIN SODIUM-DEXTROSE 2-3 GM-% IV SOLR
2.0000 g | INTRAVENOUS | Status: AC
  Administered 2011-10-14: 2 g via INTRAVENOUS
  Filled 2011-10-13: qty 50

## 2011-10-14 ENCOUNTER — Encounter (HOSPITAL_COMMUNITY): Payer: Self-pay | Admitting: *Deleted

## 2011-10-14 ENCOUNTER — Inpatient Hospital Stay (HOSPITAL_COMMUNITY)
Admission: RE | Admit: 2011-10-14 | Discharge: 2011-10-17 | DRG: 765 | Disposition: A | Discharge status: Home / Self Care | Payer: 59 | Source: Ambulatory Visit | Attending: Obstetrics and Gynecology | Admitting: Obstetrics and Gynecology

## 2011-10-14 ENCOUNTER — Inpatient Hospital Stay (HOSPITAL_COMMUNITY): Payer: 59 | Admitting: Anesthesiology

## 2011-10-14 ENCOUNTER — Encounter (HOSPITAL_COMMUNITY): Payer: Self-pay | Admitting: Anesthesiology

## 2011-10-14 ENCOUNTER — Encounter (HOSPITAL_COMMUNITY)
Admission: RE | Disposition: A | Discharge status: Home / Self Care | Payer: Self-pay | Source: Ambulatory Visit | Attending: Obstetrics and Gynecology

## 2011-10-14 DIAGNOSIS — O329XX0 Maternal care for malpresentation of fetus, unspecified, not applicable or unspecified: Secondary | ICD-10-CM | POA: Diagnosis present

## 2011-10-14 DIAGNOSIS — O139 Gestational [pregnancy-induced] hypertension without significant proteinuria, unspecified trimester: Secondary | ICD-10-CM | POA: Diagnosis present

## 2011-10-14 DIAGNOSIS — Z8759 Personal history of other complications of pregnancy, childbirth and the puerperium: Secondary | ICD-10-CM

## 2011-10-14 DIAGNOSIS — O9903 Anemia complicating the puerperium: Secondary | ICD-10-CM | POA: Diagnosis not present

## 2011-10-14 DIAGNOSIS — O321XX0 Maternal care for breech presentation, not applicable or unspecified: Principal | ICD-10-CM | POA: Diagnosis present

## 2011-10-14 DIAGNOSIS — O4100X0 Oligohydramnios, unspecified trimester, not applicable or unspecified: Secondary | ICD-10-CM | POA: Diagnosis present

## 2011-10-14 DIAGNOSIS — D62 Acute posthemorrhagic anemia: Secondary | ICD-10-CM | POA: Diagnosis not present

## 2011-10-14 HISTORY — DX: Personal history of other complications of pregnancy, childbirth and the puerperium: Z87.59

## 2011-10-14 SURGERY — Surgical Case
Anesthesia: Spinal | Wound class: Clean Contaminated

## 2011-10-14 MED ORDER — DIPHENHYDRAMINE HCL 50 MG/ML IJ SOLN: 12.5000 mg | INTRAMUSCULAR | Status: DC | PRN

## 2011-10-14 MED ORDER — MENTHOL 3 MG MT LOZG: 1.0000 | LOZENGE | OROMUCOSAL | Status: DC | PRN

## 2011-10-14 MED ORDER — LANOLIN HYDROUS EX OINT: 1.0000 "application " | TOPICAL_OINTMENT | CUTANEOUS | Status: DC | PRN

## 2011-10-14 MED ORDER — 0.9 % SODIUM CHLORIDE (POUR BTL) OPTIME
TOPICAL | Status: DC | PRN
  Administered 2011-10-14: 1000 mL

## 2011-10-14 MED ORDER — METHYLERGONOVINE MALEATE 0.2 MG PO TABS: 0.2000 mg | ORAL_TABLET | ORAL | Status: DC | PRN

## 2011-10-14 MED ORDER — METOCLOPRAMIDE HCL 5 MG/ML IJ SOLN: 10.0000 mg | Freq: Three times a day (TID) | INTRAMUSCULAR | Status: DC | PRN

## 2011-10-14 MED ORDER — BUPIVACAINE HCL (PF) 0.25 % IJ SOLN
INTRAMUSCULAR | Status: DC | PRN
  Administered 2011-10-14: 10 mL

## 2011-10-14 MED ORDER — DIBUCAINE 1 % RE OINT: 1.0000 "application " | TOPICAL_OINTMENT | RECTAL | Status: DC | PRN

## 2011-10-14 MED ORDER — TETANUS-DIPHTH-ACELL PERTUSSIS 5-2.5-18.5 LF-MCG/0.5 IM SUSP: 0.5000 mL | Freq: Once | INTRAMUSCULAR | Status: DC

## 2011-10-14 MED ORDER — DIPHENHYDRAMINE HCL 25 MG PO CAPS: 25.0000 mg | ORAL_CAPSULE | ORAL | Status: DC | PRN

## 2011-10-14 MED ORDER — DIPHENHYDRAMINE HCL 25 MG PO CAPS: 25.0000 mg | ORAL_CAPSULE | Freq: Four times a day (QID) | ORAL | Status: DC | PRN

## 2011-10-14 MED ORDER — NALBUPHINE HCL 10 MG/ML IJ SOLN
5.0000 mg | INTRAMUSCULAR | Status: DC | PRN
  Filled 2011-10-14: qty 1

## 2011-10-14 MED ORDER — WITCH HAZEL-GLYCERIN EX PADS: 1.0000 "application " | MEDICATED_PAD | CUTANEOUS | Status: DC | PRN

## 2011-10-14 MED ORDER — OXYCODONE-ACETAMINOPHEN 5-325 MG PO TABS
1.0000 | ORAL_TABLET | ORAL | Status: DC | PRN
  Administered 2011-10-14 – 2011-10-17 (×13): 1 via ORAL
  Filled 2011-10-14 (×13): qty 1

## 2011-10-14 MED ORDER — ONDANSETRON HCL 4 MG/2ML IJ SOLN: 4.0000 mg | Freq: Three times a day (TID) | INTRAMUSCULAR | Status: DC | PRN

## 2011-10-14 MED ORDER — OXYTOCIN 20 UNITS IN LACTATED RINGERS INFUSION - SIMPLE
INTRAVENOUS | Status: AC
  Administered 2011-10-14: 125 mL/h via INTRAVENOUS
  Filled 2011-10-14: qty 1000

## 2011-10-14 MED ORDER — LACTATED RINGERS IV SOLN
INTRAVENOUS | Status: DC
  Administered 2011-10-14 (×3): via INTRAVENOUS

## 2011-10-14 MED ORDER — MORPHINE SULFATE (PF) 0.5 MG/ML IJ SOLN
INTRAMUSCULAR | Status: DC | PRN
  Administered 2011-10-14: .1 mg via INTRATHECAL

## 2011-10-14 MED ORDER — ONDANSETRON HCL 4 MG/2ML IJ SOLN
INTRAMUSCULAR | Status: AC
  Filled 2011-10-14: qty 2

## 2011-10-14 MED ORDER — BUPIVACAINE HCL (PF) 0.25 % IJ SOLN
INTRAMUSCULAR | Status: AC
  Filled 2011-10-14: qty 30

## 2011-10-14 MED ORDER — FENTANYL CITRATE 0.05 MG/ML IJ SOLN
INTRAMUSCULAR | Status: AC
  Filled 2011-10-14: qty 2

## 2011-10-14 MED ORDER — FENTANYL CITRATE 0.05 MG/ML IJ SOLN
INTRAMUSCULAR | Status: DC | PRN
  Administered 2011-10-14: 15 ug via INTRATHECAL

## 2011-10-14 MED ORDER — ONDANSETRON HCL 4 MG/2ML IJ SOLN
INTRAMUSCULAR | Status: DC | PRN
  Administered 2011-10-14: 4 mg via INTRAVENOUS

## 2011-10-14 MED ORDER — BUPIVACAINE IN DEXTROSE 0.75-8.25 % IT SOLN
INTRATHECAL | Status: DC | PRN
  Administered 2011-10-14: 11.75 mg via INTRATHECAL

## 2011-10-14 MED ORDER — MEPERIDINE HCL 25 MG/ML IJ SOLN: 6.2500 mg | INTRAMUSCULAR | Status: DC | PRN

## 2011-10-14 MED ORDER — HYDROMORPHONE HCL PF 1 MG/ML IJ SOLN: 0.2500 mg | INTRAMUSCULAR | Status: DC | PRN

## 2011-10-14 MED ORDER — SCOPOLAMINE 1 MG/3DAYS TD PT72
MEDICATED_PATCH | TRANSDERMAL | Status: AC
  Administered 2011-10-14: 1.5 mg via TRANSDERMAL
  Filled 2011-10-14: qty 1

## 2011-10-14 MED ORDER — KETOROLAC TROMETHAMINE 60 MG/2ML IM SOLN
INTRAMUSCULAR | Status: AC
  Administered 2011-10-14: 60 mg via INTRAMUSCULAR
  Filled 2011-10-14: qty 2

## 2011-10-14 MED ORDER — ONDANSETRON HCL 4 MG PO TABS: 4.0000 mg | ORAL_TABLET | ORAL | Status: DC | PRN

## 2011-10-14 MED ORDER — METHYLERGONOVINE MALEATE 0.2 MG/ML IJ SOLN
0.2000 mg | INTRAMUSCULAR | Status: DC | PRN
Start: 2011-10-14 — End: 2011-10-15

## 2011-10-14 MED ORDER — SIMETHICONE 80 MG PO CHEW: 80.0000 mg | CHEWABLE_TABLET | ORAL | Status: DC | PRN

## 2011-10-14 MED ORDER — SIMETHICONE 80 MG PO CHEW
80.0000 mg | CHEWABLE_TABLET | Freq: Three times a day (TID) | ORAL | Status: DC
  Administered 2011-10-14 – 2011-10-17 (×11): 80 mg via ORAL

## 2011-10-14 MED ORDER — CEFAZOLIN SODIUM 1-5 GM-% IV SOLN
INTRAVENOUS | Status: AC
  Filled 2011-10-14: qty 50

## 2011-10-14 MED ORDER — NALOXONE HCL 0.4 MG/ML IJ SOLN: 0.4000 mg | INTRAMUSCULAR | Status: DC | PRN

## 2011-10-14 MED ORDER — DIPHENHYDRAMINE HCL 50 MG/ML IJ SOLN: 25.0000 mg | INTRAMUSCULAR | Status: DC | PRN

## 2011-10-14 MED ORDER — KETOROLAC TROMETHAMINE 60 MG/2ML IM SOLN
60.0000 mg | Freq: Once | INTRAMUSCULAR | Status: AC | PRN
  Administered 2011-10-14: 60 mg via INTRAMUSCULAR

## 2011-10-14 MED ORDER — LACTATED RINGERS IV SOLN
INTRAVENOUS | Status: DC
  Administered 2011-10-14: 19:00:00 via INTRAVENOUS

## 2011-10-14 MED ORDER — KETOROLAC TROMETHAMINE 30 MG/ML IJ SOLN
30.0000 mg | Freq: Four times a day (QID) | INTRAMUSCULAR | Status: DC | PRN
  Filled 2011-10-14: qty 1

## 2011-10-14 MED ORDER — SCOPOLAMINE 1 MG/3DAYS TD PT72
1.0000 | MEDICATED_PATCH | Freq: Once | TRANSDERMAL | Status: DC
  Filled 2011-10-14: qty 1

## 2011-10-14 MED ORDER — IBUPROFEN 600 MG PO TABS: 600.0000 mg | ORAL_TABLET | Freq: Four times a day (QID) | ORAL | Status: DC | PRN

## 2011-10-14 MED ORDER — ONDANSETRON HCL 4 MG/2ML IJ SOLN: 4.0000 mg | INTRAMUSCULAR | Status: DC | PRN

## 2011-10-14 MED ORDER — SCOPOLAMINE 1 MG/3DAYS TD PT72
1.0000 | MEDICATED_PATCH | Freq: Once | TRANSDERMAL | Status: DC
  Administered 2011-10-14: 1.5 mg via TRANSDERMAL

## 2011-10-14 MED ORDER — OXYTOCIN 20 UNITS IN LACTATED RINGERS INFUSION - SIMPLE
125.0000 mL/h | INTRAVENOUS | Status: AC
  Administered 2011-10-14: 125 mL/h via INTRAVENOUS

## 2011-10-14 MED ORDER — PHENYLEPHRINE HCL 10 MG/ML IJ SOLN
INTRAMUSCULAR | Status: DC | PRN
  Administered 2011-10-14: 80 ug via INTRAVENOUS

## 2011-10-14 MED ORDER — SODIUM CHLORIDE 0.9 % IV SOLN: 1.0000 ug/kg/h | INTRAVENOUS | Status: DC | PRN

## 2011-10-14 MED ORDER — OXYTOCIN 20 UNITS IN LACTATED RINGERS INFUSION - SIMPLE
INTRAVENOUS | Status: DC | PRN
  Administered 2011-10-14: 20 [IU] via INTRAVENOUS

## 2011-10-14 MED ORDER — SODIUM CHLORIDE 0.9 % IJ SOLN: 3.0000 mL | INTRAMUSCULAR | Status: DC | PRN

## 2011-10-14 MED ORDER — KETOROLAC TROMETHAMINE 30 MG/ML IJ SOLN
30.0000 mg | Freq: Four times a day (QID) | INTRAMUSCULAR | Status: DC | PRN
  Administered 2011-10-14: 30 mg via INTRAVENOUS

## 2011-10-14 MED ORDER — PRENATAL MULTIVITAMIN CH
1.0000 | ORAL_TABLET | Freq: Every day | ORAL | Status: DC
  Administered 2011-10-15 – 2011-10-17 (×3): 1 via ORAL
  Filled 2011-10-14 (×3): qty 1

## 2011-10-14 MED ORDER — OXYTOCIN 10 UNIT/ML IJ SOLN
INTRAMUSCULAR | Status: AC
  Filled 2011-10-14: qty 2

## 2011-10-14 MED ORDER — MORPHINE SULFATE 0.5 MG/ML IJ SOLN
INTRAMUSCULAR | Status: AC
  Filled 2011-10-14: qty 10

## 2011-10-14 MED ORDER — ZOLPIDEM TARTRATE 5 MG PO TABS: 5.0000 mg | ORAL_TABLET | Freq: Every evening | ORAL | Status: DC | PRN

## 2011-10-14 MED ORDER — IBUPROFEN 600 MG PO TABS
600.0000 mg | ORAL_TABLET | Freq: Four times a day (QID) | ORAL | Status: DC
  Administered 2011-10-14 – 2011-10-17 (×11): 600 mg via ORAL
  Filled 2011-10-14 (×11): qty 1

## 2011-10-14 MED ORDER — SENNOSIDES-DOCUSATE SODIUM 8.6-50 MG PO TABS
2.0000 | ORAL_TABLET | Freq: Every day | ORAL | Status: DC
  Administered 2011-10-14 – 2011-10-16 (×3): 2 via ORAL

## 2011-10-14 SURGICAL SUPPLY — 36 items
CLOTH BEACON ORANGE TIMEOUT ST (SAFETY) ×2 IMPLANT
CONTAINER PREFILL 10% NBF 15ML (MISCELLANEOUS) IMPLANT
DRESSING TELFA 8X3 (GAUZE/BANDAGES/DRESSINGS) IMPLANT
DRSG COVADERM 4X10 (GAUZE/BANDAGES/DRESSINGS) ×2 IMPLANT
ELECT REM PT RETURN 9FT ADLT (ELECTROSURGICAL) ×2
ELECTRODE REM PT RTRN 9FT ADLT (ELECTROSURGICAL) ×1 IMPLANT
EXTRACTOR VACUUM M CUP 4 TUBE (SUCTIONS) IMPLANT
GAUZE SPONGE 4X4 12PLY STRL LF (GAUZE/BANDAGES/DRESSINGS) ×4 IMPLANT
GLOVE BIO SURGEON STRL SZ7 (GLOVE) ×2 IMPLANT
GLOVE BIO SURGEON STRL SZ7.5 (GLOVE) ×4 IMPLANT
GLOVE BIOGEL PI IND STRL 7.0 (GLOVE) ×1 IMPLANT
GLOVE BIOGEL PI INDICATOR 7.0 (GLOVE) ×1
GLOVE SURG SS PI 7.0 STRL IVOR (GLOVE) ×2 IMPLANT
GLOVE SURG SS PI 7.5 STRL IVOR (GLOVE) ×2 IMPLANT
GOWN PREVENTION PLUS LG XLONG (DISPOSABLE) ×4 IMPLANT
GOWN PREVENTION PLUS XLARGE (GOWN DISPOSABLE) ×2 IMPLANT
KIT ABG SYR 3ML LUER SLIP (SYRINGE) IMPLANT
NEEDLE HYPO 25X1 1.5 SAFETY (NEEDLE) ×2 IMPLANT
NEEDLE HYPO 25X5/8 SAFETYGLIDE (NEEDLE) IMPLANT
NS IRRIG 1000ML POUR BTL (IV SOLUTION) ×2 IMPLANT
PACK C SECTION WH (CUSTOM PROCEDURE TRAY) ×2 IMPLANT
PAD ABD 7.5X8 STRL (GAUZE/BANDAGES/DRESSINGS) ×2 IMPLANT
RETAINER VISCERAL (MISCELLANEOUS) ×2 IMPLANT
SLEEVE SCD COMPRESS KNEE MED (MISCELLANEOUS) IMPLANT
STAPLER VISISTAT 35W (STAPLE) ×2 IMPLANT
SUT MNCRL 0 VIOLET CTX 36 (SUTURE) ×2 IMPLANT
SUT MON AB 2-0 CT1 27 (SUTURE) ×2 IMPLANT
SUT MON AB-0 CT1 36 (SUTURE) ×4 IMPLANT
SUT MONOCRYL 0 CTX 36 (SUTURE) ×2
SUT PLAIN 0 NONE (SUTURE) IMPLANT
SUT PLAIN 2 0 XLH (SUTURE) IMPLANT
SYR CONTROL 10ML LL (SYRINGE) ×2 IMPLANT
TAPE CLOTH SURG 4X10 WHT LF (GAUZE/BANDAGES/DRESSINGS) ×2 IMPLANT
TOWEL OR 17X24 6PK STRL BLUE (TOWEL DISPOSABLE) ×4 IMPLANT
TRAY FOLEY CATH 14FR (SET/KITS/TRAYS/PACK) ×2 IMPLANT
WATER STERILE IRR 1000ML POUR (IV SOLUTION) ×2 IMPLANT

## 2011-10-14 NOTE — Transfer of Care (Signed)
Immediate Anesthesia Transfer of Care Note  Patient: Candice Robertson  Procedure(s) Performed: Procedure(s) (LRB): CESAREAN SECTION (N/A)  Patient Location: PACU  Anesthesia Type: Spinal  Level of Consciousness: awake, alert  and oriented  Airway & Oxygen Therapy: Patient Spontanous Breathing  Post-op Assessment: Report given to PACU RN and Post -op Vital signs reviewed and stable  Post vital signs: Reviewed and stable  Complications: No apparent anesthesia complications

## 2011-10-14 NOTE — Transfer of Care (Signed)
Immediate Anesthesia Transfer of Care Note  Patient: Candice Robertson  Procedure(s) Performed: Procedure(s) (LRB): CESAREAN SECTION (N/A)  Patient Location: PACU  Anesthesia Type: Spinal  Level of Consciousness: awake, alert  and oriented  Airway & Oxygen Therapy: Patient Spontanous Breathing  Post-op Assessment: Report given to PACU RN and Post -op Vital signs reviewed and stable  Post vital signs: Reviewed and stable  Complications: No apparent anesthesia complications 

## 2011-10-14 NOTE — H&P (Signed)
Candice Robertson, STINGER               ACCOUNT NO.:  [id number]  MEDICAL RECORD NO.:  [id number]  LOCATION:                                 FACILITY:  PHYSICIAN:  Lenoard Aden, M.D.DATE OF BIRTH:  Feb 11, 1986  DATE OF ADMISSION:  10/14/2011 DATE OF DISCHARGE:                             HISTORY & PHYSICAL   CHIEF COMPLAINT:  Breech at 39 weeks' for primary C-section.  She is a 26 year old white female, G1, P0, at 1 weeks' gestation who presents for primary C-section due to persistent breech presentation and borderline oligohydramnios.  She has allergies to CODEINE.  MEDICATIONS:  Prenatal vitamins and hydroxyzine.  She has family history of mental retardation, diabetes, colon cancer, anxiety disorder, heart disease, hypertension.  She is a nonsmoker, nondrinker.  She denies domestic or physical violence.  This is her first pregnancy.  Prenatal course was complicated by breech presentation and a nonspecific pruritic rash, unresponsive to prednisone and __________.  PHYSICAL EXAMINATION:  GENERAL:  A well-developed, well-nourished white female, in no acute distress. VITAL SIGNS:  Height 60 inches, weight of 180 pounds. HEENT:  Normal. NECK:  Supple.  Full range of motion. LUNGS:  Clear. HEART:  Regular. ABDOMEN:  Soft, gravid, nontender.  Estimated fetal weight __________ pounds.  Breech by __________. PELVIC:  Cervix is closed, 2 cm long, breech, -2. EXTREMITIES:  No cords. NEUROLOGIC:  Nonfocal. SKIN:  Intact.  IMPRESSION:  Breech presentation at 39 weeks with borderline amniotic fluid index for primary cesarean section.  PLAN:  Proceed with primary low segment transverse cesarean section. Risks of anesthesia, infection, bleeding, injury to abdominal organs were discussed.  Delayed versus immediate complications to include bowel and bladder injury were noted.  The patient acknowledges and wishes to proceed.     Lenoard Aden, M.D.     RJT/MEDQ  D:   10/13/2011  T:  10/14/2011  Job:  [id number]

## 2011-10-14 NOTE — Anesthesia Procedure Notes (Signed)
Spinal  Patient location during procedure: OR Start time: 10/14/2011 9:46 AM Staffing Performed by: anesthesiologist  Preanesthetic Checklist Completed: patient identified, site marked, surgical consent, pre-op evaluation, timeout performed, IV checked, risks and benefits discussed and monitors and equipment checked Spinal Block Patient position: sitting Prep: site prepped and draped and DuraPrep Patient monitoring: heart rate, cardiac monitor, continuous pulse ox and blood pressure Approach: midline Location: L3-4 Injection technique: single-shot Needle Needle type: Sprotte  Needle gauge: 24 G Needle length: 9 cm Assessment Sensory level: T4 Additional Notes Clear free flow CSF on first attempt.  No paresthesia.  Patient tolerated procedure well.  Jasmine December, MD

## 2011-10-14 NOTE — Progress Notes (Signed)
Patient ID: Candice Robertson, female   DOB: 1985-11-22, 26 y.o.   MRN: [id number] Patient seen and examined. Consent witnessed and signed. No changes noted. Update completed.  Breech confirmed. BP elevated but no s/s PEC. Will proceed as scheduled.

## 2011-10-14 NOTE — Anesthesia Preprocedure Evaluation (Signed)
Anesthesia Evaluation  Patient identified by MRN, date of birth, ID band Patient awake    Reviewed: Allergy & Precautions, H&P , Patient's Chart, lab work & pertinent test results  Airway Mallampati: II TM Distance: >3 FB Neck ROM: full    Dental No notable dental hx.    Pulmonary  breath sounds clear to auscultation  Pulmonary exam normal       Cardiovascular Exercise Tolerance: Good Rhythm:regular Rate:Normal     Neuro/Psych    GI/Hepatic GERD-  ,  Endo/Other    Renal/GU      Musculoskeletal   Abdominal   Peds  Hematology   Anesthesia Other Findings Steroids for PUPS  Reproductive/Obstetrics                           Anesthesia Physical Anesthesia Plan  ASA: II  Anesthesia Plan: Spinal   Post-op Pain Management:    Induction:   Airway Management Planned:   Additional Equipment:   Intra-op Plan:   Post-operative Plan:   Informed Consent: I have reviewed the patients History and Physical, chart, labs and discussed the procedure including the risks, benefits and alternatives for the proposed anesthesia with the patient or authorized representative who has indicated his/her understanding and acceptance.   Dental Advisory Given  Plan Discussed with: CRNA  Anesthesia Plan Comments: (Lab work confirmed with CRNA in room. Platelets okay. Discussed spinal anesthetic, and patient consents to the procedure:  included risk of possible headache,backache, failed block, allergic reaction, and nerve injury. This patient was asked if she had any questions or concerns before the procedure started. )        Anesthesia Quick Evaluation

## 2011-10-14 NOTE — Anesthesia Postprocedure Evaluation (Signed)
  Anesthesia Post-op Note  Patient: Candice Robertson  Procedure(s) Performed: Procedure(s) (LRB): CESAREAN SECTION (N/A)   Patient is awake, responsive, moving her legs, and has signs of resolution of her numbness. Pain and nausea are reasonably well controlled. Vital signs are stable and clinically acceptable. Oxygen saturation is clinically acceptable. There are no apparent anesthetic complications at this time. Patient is ready for discharge.

## 2011-10-14 NOTE — Op Note (Signed)
Cesarean Section Procedure Note  Indications: malpresentation: Homero Fellers Breech Gestational HTN Oligohydramnios  Pre-operative Diagnosis: 39 week 1 day pregnancy.  Post-operative Diagnosis: same  Surgeon: Lenoard Aden   Assistants: Renae Fickle, CNM  Anesthesia: Local anesthesia 0.25.% bupivacaine and Spinal anesthesia  ASA Class: 2  Procedure Details  The patient was seen in the Holding Room. The risks, benefits, complications, treatment options, and expected outcomes were discussed with the patient.  The patient concurred with the proposed plan, giving informed consent. The risks of anesthesia, infection, bleeding and possible injury to other organs discussed. Injury to bowel, bladder, or ureter with possible need for repair discussed. Possible need for transfusion with secondary risks of hepatitis or HIV acquisition discussed. Post operative complications to include but not limited to DVT, PE and Pneumonia noted. The site of surgery properly noted/marked. The patient was taken to Operating Room # 1, identified as Keliah Harned and the procedure verified as C-Section Delivery. A Time Out was held and the above information confirmed.  After induction of anesthesia, the patient was draped and prepped in the usual sterile manner. A Pfannenstiel incision was made and carried down through the subcutaneous tissue to the fascia. Fascial incision was made and extended transversely using Mayo scissors. The fascia was separated from the underlying rectus tissue superiorly and inferiorly. The peritoneum was identified and entered. Peritoneal incision was extended longitudinally. The utero-vesical peritoneal reflection was incised transversely and the bladder flap was bluntly freed from the lower uterine segment. A low transverse uterine incision(Kerr hysterotomy) was made. Delivered from [facility] breech (with standard maneuvers) presentation was a  female with Apgar scores of 8 at one minute and 9 at five minutes.  Bulb suctioning gently performed. Neonatal team in attendance.After the umbilical cord was clamped and cut cord blood was obtained for evaluation. The placenta was removed intact and appeared normal. The uterus was curetted with a dry lap pack. Good hemostasis was noted.The uterine outline, tubes and ovaries appeared normal. The uterine incision was closed with running locked sutures of 0 Monocryl x 2 layers. Hemostasis was observed. Lavage was carried out until clear.The parietal peritoneum was closed with a running 2-0 Monocryl suture. The fascia was then reapproximated with running sutures of 0 Monocryl. The skin was reapproximated with staples.  Instrument, sponge, and needle counts were correct prior the abdominal closure and at the conclusion of the case.   Findings: above  Estimated Blood Loss:  600         Drains: foley                 Specimens: placenta                 Complications:  None; patient tolerated the procedure well.         Disposition: PACU - hemodynamically stable.         Condition: stable  Attending Attestation: I performed the procedure.

## 2011-10-15 ENCOUNTER — Encounter (HOSPITAL_COMMUNITY): Payer: Self-pay | Admitting: Obstetrics and Gynecology

## 2011-10-15 LAB — CBC
Platelets: 148 10*3/uL — ABNORMAL LOW (ref 150–400)
RBC: 3.61 MIL/uL — ABNORMAL LOW (ref 3.87–5.11)
RDW: 14.1 % (ref 11.5–15.5)
WBC: 8.8 10*3/uL (ref 4.0–10.5)

## 2011-10-15 LAB — RPR: RPR Ser Ql: NONREACTIVE

## 2011-10-15 MED ORDER — POLYSACCHARIDE IRON COMPLEX 150 MG PO CAPS
150.0000 mg | ORAL_CAPSULE | Freq: Every day | ORAL | Status: DC
  Administered 2011-10-15 – 2011-10-17 (×3): 150 mg via ORAL
  Filled 2011-10-15 (×4): qty 1

## 2011-10-15 MED ORDER — DOCUSATE SODIUM 100 MG PO CAPS
100.0000 mg | ORAL_CAPSULE | Freq: Every day | ORAL | Status: DC
  Administered 2011-10-16 – 2011-10-17 (×2): 100 mg via ORAL
  Filled 2011-10-15 (×2): qty 1

## 2011-10-15 NOTE — Progress Notes (Signed)
POSTOPERATIVE DAY # 1 S/P cesarean section   S:         Reports feeling well / minimal pain - interested in early discharge             Tolerating po intake / no nausea / no vomiting / + flatus / no BM             Bleeding is light             Pain controlled withmotrin and percocet             Up ad lib / ambulatory / voiding QS without difficulty  Newborn breast feeding  / female   O:  A & O x 3 NAD             VS: Blood pressure 107/57, pulse 88, temperature 97.9 F (36.6 C), temperature source Oral, resp. rate 20, last menstrual period 01/14/2011, SpO2 97.00%, unknown if currently breastfeeding.  LABS: WBC/Hgb/Hct/Plts:  8.8/9.9/31.3/148 (05/02 0535)   I&O:    +1968  Lungs: Clear and unlabored  Heart: regular rate and rhythm / no mumurs  Abdomen: soft, non-tender, mildly distended, bowel sounds active             Fundus: firm, non-tender, U-1             Dressing intact without drainage            Perineum: no edema  Lochia: light  Extremities: no edema, no calf pain or tenderness  A:        POD # 1 S/P cesarean section - breech             Mild ABL anemia            Stable status - desires early discharge tomorrow  P:        Routine postoperative care              Iron and colace x 4 weeks             Anticipate discharge tomorrow     Marlinda Mike 10/15/2011, 9:29 AM

## 2011-10-16 NOTE — Progress Notes (Signed)
Patient ID: Candice Robertson, female   DOB: 1986/05/24, 25 y.o.   MRN: [id number] POD # 2  Subjective: Pt reports feeling well/ Pain controlled with ibuprofen and percocet Tolerating po/Voiding without problems/ No n/v/Flatus pos Activity: out of bed and ambulate Bleeding is spotting Newborn info:  Information for the patient's newborn:  Santina, Trillo [[id number]]  female Feeding: breast   Objective: VS: Blood pressure 116/75, pulse 100, temperature 98 F (36.7 C), temperature source Oral, resp. rate 18.    Physical Exam:  General: alert, cooperative and no distress CV: Regular rate and rhythm Resp: clear Abdomen: soft, nontender, normal bowel sounds Incision: well approximated, no edema or erythema; staples intact and clean and dry Uterine Fundus: firm, below umbilicus, nontender Perineum: not inspected Lochia: minimal Ext: edema +1 pedal edema and Homans sign is negative, no sign of DVT    A/P: POD # 2/ G1P1001/ S/P C/Section d/t breech Doing well Continue routine post op orders Anticipate discharge home in the am   Signed: Demetrius Revel, MSN, [facility] 10/16/2011, 11:29 AM

## 2011-10-17 MED ORDER — IBUPROFEN 600 MG PO TABS: 600.0000 mg | ORAL_TABLET | Freq: Four times a day (QID) | ORAL | Status: AC

## 2011-10-17 MED ORDER — OXYCODONE-ACETAMINOPHEN 5-325 MG PO TABS: 1.0000 | ORAL_TABLET | ORAL | Status: AC | PRN

## 2011-10-17 MED ORDER — POLYSACCHARIDE IRON COMPLEX 150 MG PO CAPS: 150.0000 mg | ORAL_CAPSULE | Freq: Every day | ORAL | Status: DC

## 2011-10-17 NOTE — Progress Notes (Signed)
Subjective: POD# 3 Information for the patient's newborn:  Candice Robertson, Candice Robertson [[id number]]  female   Reports feeling well, tired. Feeding: breast Patient reports tolerating PO.  Breast symptoms: none Pain controlled with Motrin and Percocet. Denies HA/SOB/C/P/N/V/dizziness. Flatus present, no BM. She reports vaginal bleeding as normal, without clots.  She is ambulating, urinating without difficult.     Objective:   VS:  Filed Vitals:   10/16/11 0555 10/16/11 1407 10/16/11 2159 10/17/11 0538  BP: 116/75 142/85 131/90 105/61  Pulse: 100 108 99 84  Temp: 98 F (36.7 C) 98 F (36.7 C) 97.8 F (36.6 C) 98.3 F (36.8 C)  TempSrc: Oral Oral Oral Oral  Resp: 18 20 18 19   SpO2:        No intake or output data in the 24 hours ending 10/17/11 1034      Basename 10/15/11 0535  WBC 8.8  HGB 9.9*  HCT 31.3*  PLT 148*     Blood type: --/--/O POS (05/01 1305)  Rubella: Immune (10/03 1349)     Physical Exam:  General: alert, cooperative and no distress CV: Regular rate and rhythm Resp: clear Abdomen: soft, nontender, normal bowel sounds Incision: clean, dry, intact and mild edema around incision site, staples intact Uterine Fundus: firm, below umbilicus, nontender Lochia: minimal Ext: edema trace and Homans sign is negative, no sign of DVT      Assessment/Plan: 26 y.o.   POD# 3.  s/p Cesarean Delivery.  Indications: breech                Active Problems:  Postpartum care C/S - breech (5/1)  Gestational hypertension - delivered  BP stable  Doing well, stable.    Routine post-op care Staples to be  removed prior to discharge and replaced with benzoin and steri strips. DC home w/ [facility] instructions.   Candice Robertson 10/17/2011, 10:34 AM

## 2011-10-17 NOTE — Discharge Summary (Signed)
POSTOPERATIVE DISCHARGE SUMMARY:  Patient ID: Candice Robertson MRN: [id number] DOB/AGE: 26-06-1985 26 y.o.  Admit date: 10/14/2011 Discharge date:  10/17/2011   Admission Diagnoses: 1. Breech at 39 weeks' for primary C-section   Discharge Diagnoses:  1. Term Pregnancy-delivered 2. Post operative day 3 stable  Prenatal history: G1P1001   EDC : 10/21/2011, by Last Menstrual Period  Prenatal care at [facility] since [redacted] weeks gestation  Prenatal course was complicated by breech  presentation and a nonspecific pruritic rash, unresponsive to prednisone  and hydroxyzine.  Prenatal Labs: ABO, Rh: O (10/03 1349)  Antibody: Negative (10/03 1349) Rubella: Immune (10/03 1349)  RPR: NON REACTIVE (05/02 0535)  HBsAg: Negative (10/03 1349)  HIV: Non-reactive (10/03 1349)  GBS:   negative 1 hr Glucola : 124   Medical / Surgical History :  Past medical history:  Past Medical History  Diagnosis Date  . Breech presentation   . GERD (gastroesophageal reflux disease)   . PP care - s/p 1C/S - breech 5/1 10/14/2011  . Gestational hypertension - delivered 10/14/2011    Past surgical history:  Past Surgical History  Procedure Date  . Tonsillectomy   . Cesarean section 10/14/2011    Procedure: CESAREAN SECTION;  Surgeon: Lenoard Aden, MD;  Location: [facility];  Service: Gynecology;  Laterality: N/A;  Primary EDD: 10/21/11    Family History: History reviewed. No pertinent family history.  Social History:  reports that she quit smoking about 2 years ago. She does not have any smokeless tobacco history on file. She reports that she does not drink alcohol or use illicit drugs.   Allergies: Codeine phosphate    Current Medications at time of admission:  Prescriptions prior to admission  Medication Sig Dispense Refill  . Prenatal Vit-Fe Fumarate-FA (PRENATAL MULTIVITAMIN) TABS Take 1 tablet by mouth daily.      . ranitidine (ZANTAC) 150 MG tablet Take 150 mg by mouth 2 (two) times  daily.      Marland Kitchen DISCONTD: predniSONE (STERAPRED UNI-PAK) 5 MG TABS Take 5-20 mg by mouth taper from 4 doses each day to 1 dose and stop. Will finish steroid taper before surgery         Admit labs: 10/08/2011 WBC 10.2 / HGB 12.5 / HCT 36.3 / PLTS 240   Intrapartum Course: na  Procedures: Cesarean section delivery of female newborn by Dr Billy Coast  See operative report for further details  Postoperative / postpartum course: uneventful  Physical Exam:  VSS: Blood pressure 105/61, pulse 84, temperature 98.3 F (36.8 C), temperature source Oral, resp. rate 19, last menstrual period 01/14/2011, SpO2 98.00%, unknown if currently breastfeeding.   LABS:  Basename 10/15/11 0535  WBC 8.8  HGB 9.9*  HCT 31.3*  PLT 148*    I&O:        Incision:  approximated with staples / no erythema / no ecchymosis / no drainage Staples:  removed prior to discharge and replaced with benzoin and steri strips.   Discharge Instructions:  Discharged Condition: good Activity: pelvic rest and weight lifting and driving restrictions x 2 weeks Diet: routine Medications:  Medication List  As of 10/17/2011 10:41 AM   STOP taking these medications         predniSONE 5 MG Tabs         TAKE these medications         ibuprofen 600 MG tablet   Commonly known as: ADVIL,MOTRIN   Take 1 tablet (600 mg total) by mouth  every 6 (six) hours.      iron polysaccharides 150 MG capsule   Commonly known as: NIFEREX   Take 1 capsule (150 mg total) by mouth daily.      oxyCODONE-acetaminophen 5-325 MG per tablet   Commonly known as: PERCOCET   Take 1-2 tablets by mouth every 3 (three) hours as needed (moderate - severe pain).      prenatal multivitamin Tabs   Take 1 tablet by mouth daily.      ranitidine 150 MG tablet   Commonly known as: ZANTAC   Take 150 mg by mouth 2 (two) times daily.           Condition: stable Postpartum Instructions: refer to practice specific booklet Discharge to:  home Disposition: Final discharge disposition not confirmed Follow up :  Follow-up Information    Follow up with Lenoard Aden, MD in 6 weeks.   Contact information:   [address] [place] [address] [phone]           Signed: Arlan Organ 10/17/2011, 10:41 AM

## 2011-10-17 NOTE — Discharge Instructions (Signed)
Breast Pumping Tips Pumping your breast milk is a good way to stimulate milk production and have a steady supply of breast milk for your infant. Pumping is most helpful during your infant's growth spurts, when involving dad or a family member, or when you are away. There are several types of pumps available. They can be purchased at a baby or maternity store. You can begin pumping soon after delivery, but some experts believe that you should wait about four weeks to give your infant a bottle. In general, the more you breastfeed or pump, the more milk you will have for your infant. It is also important to take good care of yourself. This will reduce stress and help your body to create a healthy supply of milk. Your caregiver or lactation consultant can give you the information and support you need in your efforts to breastfeed your infant. PUMPING BREAST MILK  Follow the tips below for successful breast pumping. Take care of yourself.  Drink enough water or fluids to keep urine clear or pale yellow. You may notice a thirsty feeling while breastfeeding. This is because your body needs more water to make breast milk. Keep a large water bottle handy. Make healthy drink choices such as unsweetened fruit juice, milk and water. Limit soda, coffee, and alcohol (wait 2 hours to feed or pump if you have an alcoholic drink.)   Eat a healthy, well-balanced diet rich in fruits, vegetables, and whole grains.   Exercise as recommended by your caregiver.   Get plenty of sleep. Sleep when your infant sleeps. Ask friends and family for help if you need time to nap or rest.   Do not smoke. Smoking can lower your milk supply and harm your infant. If you need help quitting, ask your caregiver for a program recommendation.   Ask your caregiver about birth control options. Birth control pills may lower your milk supply. You may be advised to use condoms or other forms of birth control.  Relax and pump Stimulating your  let-down reflex is the key to successful and effective pumping. This makes the milk in all parts of the breast flow more freely.   It is easier to pump breast milk (and breastfeed) while you are relaxed. Find techniques that work for you. Quiet private spaces, breast massage, soothing heat placed on the breast, music, and pictures or a tape recording of your infant may help you to relax and "let down" your milk. If you have difficulty with your let down, try smelling one of your infant's blankets or an item of clothing he or she has worn while you are pumping.   When pumping, place the special suction cup (flange) directly over the nipple. It may be uncomfortable and cause nipple damage if it is not placed properly or is the wrong size. Applying a small amount of purified or modified lanolin to your nipple and the areola may help increase your comfort level. Also, you can change the speed and suction of many electric pumps to your comfort level. Your caregiver or lactation consultant can help you with this.   If pumping continues to be painful, or you feel you are not getting very much milk when you pump, you may need a different type of pump. A lactation consultant can help you determine if this is the case.   If you are with your infant, feed him or her on demand and try pumping after each feeding. This will boost your production, even if milk   does not come out. You may not be able to pump much milk at first, but keep up the routine, and this will change.   If you are working or away from your infant for several hours, try pumping for about 15 minutes every 2 to 3 hours. Pump both breasts at the same time if you can.   If your infant has a formula feeding, make sure you pump your milk around the same time to maintain your supply.   Begin pumping breast milk a few weeks before you return to work. This will help you develop techniques that work for you and will be able to store extra milk.   Find a  source of breastfeeding information that works well for you.  TIPS FOR STORING BREAST MILK  Store breast milk in a sealable sterile bag, jar, or container provided with your pumping supplies.   Store milk in small amounts close to what your infant is drinking at each feeding.   Cool pumped milk in a refrigerator or cooler. Pumped milk can last at the back of the refrigerator for 3 to 8 days.   Place cooled milk at the back of the freezer for up to 3 months.   Thaw the milk in its container or bag in warm water up to 24 hours in advance. Do not use a microwave to thaw or heat milk. Do not refreeze the milk after it has been thawed.   Breast milk is safe to drink when left at room temperature (mid 70s or colder) for 4 to 8 hours. After that, throw it away.   Milk fat can separate and look funny. The color can vary slightly from day to day. This is normal. Always shake the milk before using it to mix the fat with the more watery portion.  SEEK MEDICAL CARE IF:   You are having trouble pumping or feeding your infant.   You are concerned that you are not making enough milk.   You have nipple pain, soreness, or redness.   You have other questions or concerns related to you or your infant.  Document Released: 11/19/2009 Document Revised: 05/21/2011 Document Reviewed: 11/19/2009 [employer] Patient Information 2012 [employer].Postpartum Depression and Baby Blues The postpartum period begins right after the birth of a baby. During this time, there is often a great amount of joy and excitement. It is also a time of considerable changes in the life of the parent(s). Regardless of how many times a mother gives birth, each child brings new challenges and dynamics to the family. It is not unusual to have feelings of excitement accompanied by confusing shifts in moods, emotions, and thoughts. All mothers are at risk of developing postpartum depression or the "baby blues." These mood changes can occur  right after giving birth, or they may occur many months after giving birth. The baby blues or postpartum depression can be mild or severe. Additionally, postpartum depression can resolve rather quickly, or it can be a long-term condition. CAUSES Elevated hormones and their rapid decline are thought to be a main cause of postpartum depression and the baby blues. There are a number of hormones that radically change during and after pregnancy. Estrogen and progesterone usually decrease immediately after delivering your baby. The level of thyroid hormone and various cortisol steroids also rapidly drop. Other factors that play a major role in these changes include major life events and genetics.  RISK FACTORS If you have any of the following risks for the   baby blues or postpartum depression, know what symptoms to watch out for during the postpartum period. Risk factors that may increase the likelihood of getting the baby blues or postpartum depression include:  Havinga personal or family history of depression.   Having depression while being pregnant.   Having premenstrual or oral contraceptive-associated mood issues.   Having exceptional life stress.   Having marital conflict.   Lacking a social support network.   Having a baby with special needs.   Having health problems such as diabetes.  SYMPTOMS Baby blues symptoms include:  Brief fluctuations in mood, such as going from extreme happiness to sadness.   Decreased concentration.   Difficulty sleeping.   Crying spells, tearfulness.   Irritability.   Anxiety.  Postpartum depression symptoms typically begin within the first month after giving birth. These symptoms include:  Difficulty sleeping or excessive sleepiness.   Marked weight loss.   Agitation.   Feelings of worthlessness.   Lack of interest in activity or food.  Postpartum psychosis is a very concerning condition and can be dangerous. Fortunately, it is rare.  Displaying any of the following symptoms is cause for immediate medical attention. Postpartum psychosis symptoms include:  Hallucinations and delusions.   Bizarre or disorganized behavior.   Confusion or disorientation.  DIAGNOSIS  A diagnosis is made by an evaluation of your symptoms. There are no medical or lab tests that lead to a diagnosis, but there are various questionnaires that a caregiver may use to identify those with the baby blues, postpartum depression, or psychosis. Often times, a screening tool called the Edinburgh Postnatal Depression Scale is used to diagnose depression in the postpartum period.  TREATMENT The baby blues usually goes away on its own in 1 to 2 weeks. Social support is often all that is needed. You should be encouraged to get adequate sleep and rest. Occasionally, you may be given medicines to help you sleep.  Postpartum depression requires treatment as it can last several months or longer if it is not treated. Treatment may include individual or group therapy, medicine, or both to address any social, physiological, and psychological factors that may play a role in the depression. Regular exercise, a healthy diet, rest, and social support may also be strongly recommended.  Postpartum psychosis is more serious and needs treatment right away. Hospitalization is often needed. HOME CARE INSTRUCTIONS  Get as much rest as you can. Nap when the baby sleeps.   Exercise regularly. Some women find yoga and walking to be beneficial.   Eat a balanced and nourishing diet.   Do little things that you enjoy. Have a cup of tea, take a bubble bath, read your favorite magazine, or listen to your favorite music.   Avoid alcohol.   Ask for help with household chores, cooking, grocery shopping, or running errands as needed. Do not try to do everything.   Talk to people close to you about how you are feeling. Get support from your partner, family members, friends, or other new  moms.   Try to stay positive in how you think. Think about the things you are grateful for.   Do not spend a lot of time alone.   Only take medicine as directed by your caregiver.   Keep all your postpartum appointments.   Let your caregiver know if you have any concerns.  SEEK MEDICAL CARE IF: You are having a reaction or problems with your medicine. SEEK IMMEDIATE MEDICAL CARE IF:  You have suicidal feelings.     You feel you may harm the baby or someone else.  Document Released: 03/05/2004 Document Revised: 05/21/2011 Document Reviewed: 04/07/2011 [employer] Patient Information 2012 [employer]. 

## 2012-06-11 ENCOUNTER — Emergency Department
Admission: EM | Admit: 2012-06-11 | Discharge: 2012-06-11 | Disposition: A | Discharge status: Home / Self Care | Payer: 59 | Source: Home / Self Care | Attending: Family Medicine | Admitting: Family Medicine

## 2012-06-11 DIAGNOSIS — J111 Influenza due to unidentified influenza virus with other respiratory manifestations: Secondary | ICD-10-CM

## 2012-06-11 MED ORDER — OSELTAMIVIR PHOSPHATE 75 MG PO CAPS: 75.0000 mg | ORAL_CAPSULE | Freq: Two times a day (BID) | ORAL | Status: DC

## 2012-06-11 NOTE — ED Notes (Signed)
Symptoms started yesterday with sudden loss of energy/fatigue, lower back pain, fever and chills.  T- max this am 100.5

## 2012-06-11 NOTE — ED Provider Notes (Signed)
History     CSN: [id number]  Arrival date & time 06/11/12  4098   First MD Initiated Contact with Patient 06/11/12 1047      Chief Complaint  Patient presents with  . Influenza      HPI Comments: Patient developed sudden onset flu-like symptoms yesterday with myalgias, sweats/chills, nausea without vomiting, fatigue, and tightness in anterior chest.  She had a flu shot in August.  The history is provided by the patient.    Past Medical History  Diagnosis Date  . Breech presentation   . GERD (gastroesophageal reflux disease)   . PP care - s/p 1C/S - breech 5/1 10/14/2011    Past Surgical History  Procedure Date  . Tonsillectomy   . Cesarean section 10/14/2011    Procedure: CESAREAN SECTION;  Surgeon: Lenoard Aden, MD;  Location: [facility];  Service: Gynecology;  Laterality: N/A;  Primary EDD: 10/21/11    Family History  Problem Relation Age of Onset  . Asthma Other     History  Substance Use Topics  . Smoking status: Former Smoker    Quit date: 10/07/2009  . Smokeless tobacco: Not on file  . Alcohol Use: No    OB History    Grav Para Term Preterm Abortions TAB SAB Ect Mult Living   1 1 1       1       Review of Systems No sore throat No cough No pleuritic pain No wheezing No nasal congestion No post-nasal drainage No sinus pain/pressure No itchy/red eyes No earache No hemoptysis No SOB + fever, + chills + nausea No vomiting No abdominal pain No diarrhea No urinary symptoms No skin rashes + fatigue + myalgias + headache Used OTC meds without relief  Allergies  Codeine phosphate  Home Medications   Current Outpatient Rx  Name  Route  Sig  Dispense  Refill  . POLYSACCHARIDE IRON COMPLEX 150 MG PO CAPS   Oral   Take 1 capsule (150 mg total) by mouth daily.   30 capsule   2   . OSELTAMIVIR PHOSPHATE 75 MG PO CAPS   Oral   Take 1 capsule (75 mg total) by mouth every 12 (twelve) hours.   10 capsule   0   . PRENATAL MULTIVITAMIN CH  Oral   Take 1 tablet by mouth daily.         Marland Kitchen RANITIDINE HCL 150 MG PO TABS   Oral   Take 150 mg by mouth 2 (two) times daily.           BP 139/83  Pulse 110  Temp 98.7 F (37.1 C) (Oral)  Resp 20  Ht 5' (1.524 m)  Wt 145 lb (65.772 kg)  BMI 28.32 kg/m2  SpO2 98%  LMP 06/01/2012  Physical Exam Nursing notes and Vital Signs reviewed. Appearance:  Patient appears healthy, stated age, and in no acute distress Eyes:  Pupils are equal, round, and reactive to light and accomodation.  Extraocular movement is intact.  Conjunctivae are not inflamed  Ears:  Canals normal.  Tympanic membranes normal.  Nose:  Mildly congested turbinates.  No sinus tenderness.  Pharynx:  Normal Neck:  Supple.  Slightly tender shotty posterior nodes are palpated bilaterally  Lungs:  Clear to auscultation.  Breath sounds are equal.  Heart:  Regular rate and rhythm without murmurs, rubs, or gallops.  Abdomen:  Nontender without masses or hepatosplenomegaly.  Bowel sounds are present.  No CVA or flank tenderness.  Extremities:  No edema.  No calf tenderness Skin:  No rash present.   ED Course  Procedures       1. Influenza-like illness       MDM  Begin empiric Tamiflu Rest, increase fluid intake.  Take Tylenol or Ibuprofen for body aches, fever, etc. Followup with Family Doctor if not improved in 3 days. If symptoms become significantly worse during the night or over the weekend, proceed to the local emergency room.         Lattie Haw, MD 06/14/12 2025

## 2012-09-02 ENCOUNTER — Encounter (HOSPITAL_COMMUNITY): Payer: Self-pay | Admitting: Emergency Medicine

## 2012-09-02 ENCOUNTER — Emergency Department (HOSPITAL_COMMUNITY)
Admission: EM | Admit: 2012-09-02 | Discharge: 2012-09-02 | Disposition: A | Discharge status: Home / Self Care | Payer: 59 | Source: Home / Self Care | Attending: Family Medicine | Admitting: Family Medicine

## 2012-09-02 DIAGNOSIS — H109 Unspecified conjunctivitis: Secondary | ICD-10-CM

## 2012-09-02 MED ORDER — MOXIFLOXACIN HCL 0.5 % OP SOLN: 1.0000 [drp] | Freq: Three times a day (TID) | OPHTHALMIC | Status: DC

## 2012-09-02 NOTE — ED Provider Notes (Signed)
History     CSN: [id number]  Arrival date & time 09/02/12  1500   First MD Initiated Contact with Patient 09/02/12 1534      No chief complaint on file.   (Consider location/radiation/quality/duration/timing/severity/associated sxs/prior treatment) Patient is a 27 y.o. female presenting with conjunctivitis. The history is provided by the patient.  Conjunctivitis  The current episode started 3 to 5 days ago. The onset was gradual. The problem has been gradually worsening. The problem is mild. Associated symptoms include eye discharge and eye redness. Pertinent negatives include no fever, no double vision, no eye itching, no photophobia, no congestion, no rhinorrhea, no sore throat, no URI and no eye pain.    Past Medical History  Diagnosis Date  . Breech presentation   . GERD (gastroesophageal reflux disease)   . PP care - s/p 1C/S - breech 5/1 10/14/2011    Past Surgical History  Procedure Laterality Date  . Tonsillectomy    . Cesarean section  10/14/2011    Procedure: CESAREAN SECTION;  Surgeon: Lenoard Aden, MD;  Location: [facility];  Service: Gynecology;  Laterality: N/A;  Primary EDD: 10/21/11    Family History  Problem Relation Age of Onset  . Asthma Other     History  Substance Use Topics  . Smoking status: Former Smoker    Quit date: 10/07/2009  . Smokeless tobacco: Not on file  . Alcohol Use: No    OB History   Grav Para Term Preterm Abortions TAB SAB Ect Mult Living   1 1 1       1       Review of Systems  Constitutional: Negative.  Negative for fever.  HENT: Negative for congestion, sore throat, facial swelling, rhinorrhea and postnasal drip.   Eyes: Positive for discharge and redness. Negative for double vision, photophobia, pain, itching and visual disturbance.  Respiratory: Negative.     Allergies  Codeine phosphate  Home Medications   Current Outpatient Rx  Name  Route  Sig  Dispense  Refill  . iron polysaccharides (NIFEREX) 150 MG capsule  Oral   Take 1 capsule (150 mg total) by mouth daily.   30 capsule   2   . moxifloxacin (VIGAMOX) 0.5 % ophthalmic solution   Right Eye   Place 1 drop into the right eye 3 (three) times daily. After warm soak to eye   3 mL   0   . oseltamivir (TAMIFLU) 75 MG capsule   Oral   Take 1 capsule (75 mg total) by mouth every 12 (twelve) hours.   10 capsule   0   . Prenatal Vit-Fe Fumarate-FA (PRENATAL MULTIVITAMIN) TABS   Oral   Take 1 tablet by mouth daily.         . ranitidine (ZANTAC) 150 MG tablet   Oral   Take 150 mg by mouth 2 (two) times daily.           BP 128/90  Pulse 76  Temp(Src) 98.3 F (36.8 C) (Oral)  Resp 18  SpO2 100%  Physical Exam  Nursing note and vitals reviewed. Constitutional: She appears well-developed and well-nourished.  HENT:  Head: Normocephalic.  Right Ear: External ear normal.  Left Ear: External ear normal.  Nose: Nose normal.  Mouth/Throat: Oropharynx is clear and moist.  Eyes: EOM are normal. Pupils are equal, round, and reactive to light. No foreign bodies found. Right conjunctiva is injected. Right conjunctiva has no hemorrhage. Left conjunctiva is not injected. Left conjunctiva has no  hemorrhage.      ED Course  Procedures (including critical care time)  Labs Reviewed - No data to display No results found.   1. Conjunctivitis of right eye       MDM          Linna Hoff, MD 09/02/12 1627

## 2012-09-02 NOTE — ED Notes (Signed)
Pt c/o right eye discharge and redness. Gradual on set x 3 to 5 days. Pt denies any other symptoms. Pt has not tried any otc meds for treatment.

## 2013-08-29 ENCOUNTER — Ambulatory Visit (INDEPENDENT_AMBULATORY_CARE_PROVIDER_SITE_OTHER): Payer: 59 | Admitting: Family Medicine

## 2013-08-29 ENCOUNTER — Encounter: Payer: Self-pay | Admitting: Family Medicine

## 2013-08-29 VITALS — BP 127/79 | HR 84 | Ht 60.0 in | Wt 148.0 lb

## 2013-08-29 DIAGNOSIS — F411 Generalized anxiety disorder: Secondary | ICD-10-CM

## 2013-08-29 DIAGNOSIS — H539 Unspecified visual disturbance: Secondary | ICD-10-CM

## 2013-08-29 MED ORDER — CLONAZEPAM 0.5 MG PO TABS: 0.2500 mg | ORAL_TABLET | Freq: Every day | ORAL | Status: DC | PRN

## 2013-08-29 NOTE — Progress Notes (Signed)
Subjective:    Patient ID: Candice Robertson, female    DOB: Mar 27, 1986, 28 y.o.   MRN: [id number]  HPI  She is here to restab care.  I last saw her 3 years ago for anxiety.  Then got pregnant and was seeing her OB.  Feels like her anxiety has started again. 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 Wakes up frequently but says she feels she sleeps ok. 1 cup of coffee in am. No other caffeine. She is having some palpitations.  Occuring 1-2 times a month.  Feels like she has almost fainted a few times.  Happened while driving a few weeks ago.  jUst hits for a few minutes.  Can last an hour.  Can usually calm herself down. Mom and GM have very similar sxs. Feels like havin panic attack once a week.  She does feel nervous and on edge nearly every day. She does describe being restless at times. She becomes easily annoyed and irritable nearly every day. Has trouble relaxing. It often feels afraid that something might happen more than half the days.  Has been on eye srain by the end of the day. Has been working on the computer more with this new job. She would like ot have something as needed for anxiety.She feels like she is dying when it happens.      Review of Systems  Constitutional: Negative for fever, diaphoresis and unexpected weight change.  HENT: Negative for hearing loss, rhinorrhea and tinnitus.   Eyes: Positive for visual disturbance.  Respiratory: Negative for cough and wheezing.   Cardiovascular: Positive for palpitations. Negative for chest pain.  Gastrointestinal: Negative for nausea, vomiting, diarrhea and blood in stool.  Genitourinary: Negative for vaginal bleeding, vaginal discharge and difficulty urinating.  Musculoskeletal: Negative for arthralgias and myalgias.  Skin: Negative for rash.  Neurological: Negative for headaches.  Hematological: Negative for adenopathy. Does not bruise/bleed easily.  Psychiatric/Behavioral: Negative for sleep disturbance and  dysphoric mood. The patient is nervous/anxious.    BP 127/79  Pulse 84  Ht 5' (1.524 m)  Wt 148 lb (67.132 kg)  BMI 28.90 kg/m2  LMP 08/25/2013    Allergies  Allergen Reactions  . Codeine Phosphate     REACTION: nausea, vomiting, migraines    Past Medical History  Diagnosis Date  . Breech presentation   . GERD (gastroesophageal reflux disease)   . PP care - s/p 1C/S - breech 5/1 10/14/2011    Past Surgical History  Procedure Laterality Date  . Tonsillectomy    . Cesarean section  10/14/2011    Procedure: CESAREAN SECTION;  Surgeon: Lovenia Kim, MD;  Location: [facility];  Service: Gynecology;  Laterality: N/A;  Primary EDD: 10/21/11  . Tonsilectomy/adenoidectomy with myringotomy  1991  . Tympanostomy tube placement  1989    History   Social History  . Marital Status: Married    Spouse Name: N/A    Number of Children: N/A  . Years of Education: N/A   Occupational History  . Not on file.   Social History Main Topics  . Smoking status: Former Smoker    Quit date: 10/07/2009  . Smokeless tobacco: Not on file  . Alcohol Use: Yes  . Drug Use: No  . Sexual Activity: Yes    Birth Control/ Protection: Condom, Pill   Other Topics Concern  . Not on file   Social History Narrative   1 cup of coffee in the morning. No  other caffeine. No regular exercise.    Family History  Problem Relation Age of Onset  . Alcohol abuse Maternal Uncle   . Diabetes Maternal Uncle   . Heart attack Maternal Grandmother   . Depression Maternal Grandmother     Outpatient Encounter Prescriptions as of 08/29/2013  Medication Sig  . levonorgestrel-ethinyl estradiol (AVIANE,ALESSE,LESSINA) 0.1-20 MG-MCG tablet Take 1 tablet by mouth daily.  . clonazePAM (KLONOPIN) 0.5 MG tablet Take 0.5-1 tablets (0.25-0.5 mg total) by mouth daily as needed for anxiety.  . [DISCONTINUED] iron polysaccharides (NIFEREX) 150 MG capsule Take 1 capsule (150 mg total) by mouth daily.  . [DISCONTINUED] moxifloxacin  (VIGAMOX) 0.5 % ophthalmic solution Place 1 drop into the right eye 3 (three) times daily. After warm soak to eye  . [DISCONTINUED] oseltamivir (TAMIFLU) 75 MG capsule Take 1 capsule (75 mg total) by mouth every 12 (twelve) hours.  . [DISCONTINUED] Prenatal Vit-Fe Fumarate-FA (PRENATAL MULTIVITAMIN) TABS Take 1 tablet by mouth daily.  . [DISCONTINUED] ranitidine (ZANTAC) 150 MG tablet Take 150 mg by mouth 2 (two) times daily.          Objective:   Physical Exam  Constitutional: She is oriented to person, place, and time. She appears well-developed and well-nourished.  HENT:  Head: Normocephalic and atraumatic.  Cardiovascular: Normal rate, regular rhythm and normal heart sounds.   Pulmonary/Chest: Effort normal and breath sounds normal.  Neurological: She is alert and oriented to person, place, and time.  Skin: Skin is warm and dry.  Psychiatric: She has a normal mood and affect. Her behavior is normal.          Assessment & Plan:  GAD- GAD-7 score of 17 (severe).   Discussed tx options.  Discussed risk and benefits of short term medications. Will start with Klonopin 0.5 mg. Recommend start with half a tab. Warned about potential side effects including sedation and dependency. Also discussed that depending how often she may need a short-term medication we may need to look at daily medication such as an SSRI. She is open to this but wants to hold off on a daily medication if possible. Also recommend regular exercise as this will hopefully help lower her stress levels in addition to possible therapy or counseling. She says she'll certainly consider it. Encourage regular exercise.  She is moving to [place] soon and hopes will be able to exercise more.   Vision changes - will refer ophthomology for further evaluation.  She did great on vision screening today. Consider getting a low grade over-the-counter reader for the afternoons when she feels that she's getting a little bit more eyestrain.  Also recommend formal evaluation with an optometrist or ophthalmologist.  Time spent 30 minutes, greater than 50% the time spent counseling about generalized anxiety.

## 2013-10-24 ENCOUNTER — Encounter: Payer: Self-pay | Admitting: Family Medicine

## 2013-10-24 ENCOUNTER — Ambulatory Visit (INDEPENDENT_AMBULATORY_CARE_PROVIDER_SITE_OTHER): Payer: 59 | Admitting: Family Medicine

## 2013-10-24 VITALS — BP 124/77 | HR 88 | Ht 60.0 in | Wt 147.0 lb

## 2013-10-24 DIAGNOSIS — F411 Generalized anxiety disorder: Secondary | ICD-10-CM

## 2013-10-24 NOTE — Progress Notes (Signed)
   Subjective:    Patient ID: Candice Robertson, female    DOB: 1985/12/09, 28 y.o.   MRN: [id number]  HPI GAD- she has been working out regulary. She got an exercise maching at home and has been using it nightly. Used the clonazepam once since I last saw. Says knowing she has it helps her mentally to know she has a rescue drug.  She tolreated the medication well without any side effects and says it works well.   Review of Systems     Objective:   Physical Exam  Constitutional: She is oriented to person, place, and time. She appears well-developed and well-nourished.  HENT:  Head: Normocephalic and atraumatic.  Neurological: She is alert and oriented to person, place, and time.  Skin: Skin is warm and dry.  Psychiatric: She has a normal mood and affect. Her behavior is normal. Judgment and thought content normal.          Assessment & Plan:  GAD- GAD- 7 score of 2 today. Previous of 17.  Continue to use the med sparingly. Reminded her again about potential for dependency. She's only used one tab since I last saw her which is fantastic. Continue with regular exercise as well as I definitely think this is helping control her mood. Still consider SSRI if needed in future or counseling if needed.

## 2013-10-31 ENCOUNTER — Ambulatory Visit: Payer: 59 | Admitting: Family Medicine

## 2014-04-16 ENCOUNTER — Encounter: Payer: Self-pay | Admitting: Family Medicine

## 2014-04-24 ENCOUNTER — Ambulatory Visit (INDEPENDENT_AMBULATORY_CARE_PROVIDER_SITE_OTHER): Payer: 59 | Admitting: Family Medicine

## 2014-04-24 ENCOUNTER — Encounter: Payer: Self-pay | Admitting: Family Medicine

## 2014-04-24 VITALS — BP 128/75 | HR 77 | Temp 98.0°F | Wt 150.0 lb

## 2014-04-24 DIAGNOSIS — F430 Acute stress reaction: Secondary | ICD-10-CM

## 2014-04-24 MED ORDER — CLONAZEPAM 0.5 MG PO TABS: 0.2500 mg | ORAL_TABLET | Freq: Every day | ORAL | Status: DC | PRN

## 2014-04-24 NOTE — Progress Notes (Signed)
   Subjective:    Patient ID: Candice Robertson, female    DOB: 1985/12/23, 28 y.o.   MRN: [id number]  HPI Here for six-month follow-up for anxiety. She uses Xanax as needed. She does complain of some irritability.Her GM died over the summer and she says that was really stressful. Now trying to sell her GM's house.  hasnt't need it since August.    Review of Systems     Objective:   Physical Exam  Constitutional: She is oriented to person, place, and time. She appears well-developed and well-nourished.  HENT:  Head: Normocephalic and atraumatic.  Cardiovascular: Normal rate, regular rhythm and normal heart sounds.   Pulmonary/Chest: Effort normal and breath sounds normal.  Neurological: She is alert and oriented to person, place, and time.  Skin: Skin is warm and dry.  Psychiatric: She has a normal mood and affect. Her behavior is normal.          Assessment & Plan:  Generalized anxiety-gad 7 score of 1 today. She rates her symptoms as somewhat difficult. Previous score was 2.doing well on regimen.  Using sparingly. Refill today.  F/U in 6 months.

## 2014-08-08 ENCOUNTER — Emergency Department (HOSPITAL_COMMUNITY)
Admission: EM | Admit: 2014-08-08 | Discharge: 2014-08-08 | Disposition: A | Discharge status: Home / Self Care | Payer: 59 | Attending: Emergency Medicine | Admitting: Emergency Medicine

## 2014-08-08 ENCOUNTER — Encounter (HOSPITAL_COMMUNITY): Payer: Self-pay | Admitting: Family Medicine

## 2014-08-08 DIAGNOSIS — Z3A01 Less than 8 weeks gestation of pregnancy: Secondary | ICD-10-CM | POA: Diagnosis not present

## 2014-08-08 DIAGNOSIS — Z87891 Personal history of nicotine dependence: Secondary | ICD-10-CM | POA: Insufficient documentation

## 2014-08-08 DIAGNOSIS — Z79899 Other long term (current) drug therapy: Secondary | ICD-10-CM | POA: Diagnosis not present

## 2014-08-08 DIAGNOSIS — R000 Tachycardia, unspecified: Secondary | ICD-10-CM | POA: Insufficient documentation

## 2014-08-08 DIAGNOSIS — O9989 Other specified diseases and conditions complicating pregnancy, childbirth and the puerperium: Secondary | ICD-10-CM | POA: Diagnosis present

## 2014-08-08 DIAGNOSIS — Z8719 Personal history of other diseases of the digestive system: Secondary | ICD-10-CM | POA: Diagnosis not present

## 2014-08-08 DIAGNOSIS — R002 Palpitations: Secondary | ICD-10-CM | POA: Diagnosis not present

## 2014-08-08 LAB — CBC
HCT: 37.5 % (ref 36.0–46.0)
HEMOGLOBIN: 12.7 g/dL (ref 12.0–15.0)
MCH: 29.7 pg (ref 26.0–34.0)
MCHC: 33.9 g/dL (ref 30.0–36.0)
MCV: 87.6 fL (ref 78.0–100.0)
Platelets: 196 10*3/uL (ref 150–400)
RBC: 4.28 MIL/uL (ref 3.87–5.11)
RDW: 12.5 % (ref 11.5–15.5)
WBC: 7 10*3/uL (ref 4.0–10.5)

## 2014-08-08 LAB — BASIC METABOLIC PANEL
Anion gap: 8 (ref 5–15)
BUN: 7 mg/dL (ref 6–23)
CO2: 24 mmol/L (ref 19–32)
Calcium: 9.5 mg/dL (ref 8.4–10.5)
Chloride: 106 mmol/L (ref 96–112)
Creatinine, Ser: 0.49 mg/dL — ABNORMAL LOW (ref 0.50–1.10)
GFR calc non Af Amer: >90 mL/min (ref >90)
GLUCOSE: 93 mg/dL (ref 70–99)
POTASSIUM: 3.3 mmol/L — AB (ref 3.5–5.1)
SODIUM: 138 mmol/L (ref 135–145)

## 2014-08-08 LAB — I-STAT CHEM 8, ED
BUN: <3 mg/dL — ABNORMAL LOW (ref 6–23)
CHLORIDE: 104 mmol/L (ref 96–112)
CREATININE: 0.5 mg/dL (ref 0.50–1.10)
Calcium, Ion: 1.21 mmol/L (ref 1.12–1.23)
Glucose, Bld: 122 mg/dL — ABNORMAL HIGH (ref 70–99)
HCT: 38 % (ref 36.0–46.0)
Hemoglobin: 12.9 g/dL (ref 12.0–15.0)
POTASSIUM: 3.9 mmol/L (ref 3.5–5.1)
Sodium: 140 mmol/L (ref 135–145)
TCO2: 18 mmol/L (ref 0–100)

## 2014-08-08 LAB — D-DIMER, QUANTITATIVE: D-Dimer, Quant: <0.27 ug/mL-FEU (ref 0.00–0.48)

## 2014-08-08 LAB — I-STAT TROPONIN, ED: TROPONIN I, POC: 0 ng/mL (ref 0.00–0.08)

## 2014-08-08 LAB — TSH: TSH: 1.806 u[IU]/mL (ref 0.350–4.500)

## 2014-08-08 NOTE — ED Provider Notes (Signed)
MSE was initiated and I personally evaluated the patient and placed orders (if any) at  1545  on August 08, 2014.  The patient appears stable so that the remainder of the MSE may be completed by another provider. She is pregnant and has new episodic tachycardia w/o CP.  Carmin Muskrat, MD 08/08/14 [phone]

## 2014-08-08 NOTE — ED Notes (Signed)
Pt here for sudden onset of tachycardia, SOB. Pt works downstairs in the heart failure clinic and took pulse and was 140. sts she is [redacted] weeks pregnant. CBG 126 after OJ. Denies any vaginal bleeding. Denies chesty pain and abdominal pain. sts she feels like she is going to pass out.

## 2014-08-08 NOTE — Discharge Instructions (Signed)
Rest at home and follow up with your ob-gyn md in 1-2 days

## 2014-08-08 NOTE — ED Provider Notes (Signed)
CSN: [id number]     Arrival date & time 08/08/14  1434 History   First MD Initiated Contact with Patient 08/08/14 1559     Chief Complaint  Patient presents with  . Tachycardia     (Consider location/radiation/quality/duration/timing/severity/associated sxs/prior Treatment) Patient is a 29 y.o. female presenting with palpitations. The history is provided by the patient (the pt states she had palpitations and rapid heart rate.   rate was possibly 150).  Palpitations Palpitations quality:  Fast Onset quality:  Sudden Timing:  Intermittent Progression:  Partially resolved Chronicity:  New Context: not anxiety   Relieved by:  Nothing Associated symptoms: no back pain, no chest pain and no cough     Past Medical History  Diagnosis Date  . Breech presentation   . GERD (gastroesophageal reflux disease)   . PP care - s/p 1C/S - breech 5/1 10/14/2011   Past Surgical History  Procedure Laterality Date  . Tonsillectomy    . Cesarean section  10/14/2011    Procedure: CESAREAN SECTION;  Surgeon: Lovenia Kim, MD;  Location: [facility];  Service: Gynecology;  Laterality: N/A;  Primary EDD: 10/21/11  . Tonsilectomy/adenoidectomy with myringotomy  1991  . Tympanostomy tube placement  1989   Family History  Problem Relation Age of Onset  . Alcohol abuse Maternal Uncle   . Diabetes Maternal Uncle   . Heart attack Maternal Grandmother   . Depression Maternal Grandmother    History  Substance Use Topics  . Smoking status: Former Smoker    Quit date: 10/07/2009  . Smokeless tobacco: Not on file  . Alcohol Use: Yes   OB History    Gravida Para Term Preterm AB TAB SAB Ectopic Multiple Living   2 1 1       1      Review of Systems  Constitutional: Negative for appetite change and fatigue.  HENT: Negative for congestion, ear discharge and sinus pressure.   Eyes: Negative for discharge.  Respiratory: Negative for cough.   Cardiovascular: Positive for palpitations. Negative for chest pain.   Gastrointestinal: Negative for abdominal pain and diarrhea.  Genitourinary: Negative for frequency and hematuria.  Musculoskeletal: Negative for back pain.  Skin: Negative for rash.  Neurological: Negative for seizures and headaches.  Psychiatric/Behavioral: Negative for hallucinations.      Allergies  Codeine phosphate  Home Medications   Prior to Admission medications   Medication Sig Start Date End Date Taking? Authorizing Provider  acetaminophen (TYLENOL) 500 MG tablet Take 500 mg by mouth every 6 (six) hours as needed.   Yes Historical Provider, MD  docusate sodium (COLACE) 100 MG capsule Take 100 mg by mouth as needed for mild constipation.   Yes Historical Provider, MD  Prenatal Vit-Fe Fumarate-FA (PRENATAL MULTIVITAMIN) TABS tablet Take 1 tablet by mouth daily at 12 noon.   Yes Historical Provider, MD  clonazePAM (KLONOPIN) 0.5 MG tablet Take 0.5-1 tablets (0.25-0.5 mg total) by mouth daily as needed for anxiety. Patient not taking: Reported on 08/08/2014 04/24/14   [name], MD   BP 123/75 mmHg  Pulse 101  Temp(Src) 97.9 F (36.6 C) (Oral)  Resp 22  SpO2 100%  LMP 06/19/2014 Physical Exam  Constitutional: She is oriented to person, place, and time. She appears well-developed.  HENT:  Head: Normocephalic.  Eyes: Conjunctivae and EOM are normal. No scleral icterus.  Neck: Neck supple. No thyromegaly present.  Cardiovascular: Regular rhythm.  Exam reveals no gallop and no friction rub.   No murmur heard.  Sinus tach  Pulmonary/Chest: No stridor. She has no wheezes. She has no rales. She exhibits no tenderness.  Abdominal: She exhibits no distension. There is no tenderness. There is no rebound.  Musculoskeletal: Normal range of motion. She exhibits no edema.  Lymphadenopathy:    She has no cervical adenopathy.  Neurological: She is oriented to person, place, and time. She exhibits normal muscle tone. Coordination normal.  Skin: No rash noted. No erythema.   Psychiatric: She has a normal mood and affect. Her behavior is normal.    ED Course  Procedures (including critical care time) Labs Review Labs Reviewed  I-STAT CHEM 8, ED - Abnormal; Notable for the following:    BUN <3 (*)    Glucose, Bld 122 (*)    All other components within normal limits  CBC  D-DIMER, QUANTITATIVE  BASIC METABOLIC PANEL  TSH  I-STAT TROPOININ, ED    Imaging Review No results found.   EKG Interpretation   Date/Time:  Wednesday August 08 2014 14:37:30 EST Ventricular Rate:  122 PR Interval:  148 QRS Duration: 84 QT Interval:  324 QTC Calculation: 461 R Axis:   87 Text Interpretation:  Sinus tachycardia Nonspecific ST and T wave  abnormality Abnormal ECG Sinus tachycardia ST-t wave abnormality Abnormal  ekg Confirmed by Carmin Muskrat  MD [phone]) on 08/08/2014 3:01:31 PM      MDM   Final diagnoses:  Tachycardia    Pt had [place] by cardiology,  Heart function normal.   Tachycardia possible svt,  Nl now.  She will follow up with ob-gyn this week.   Maudry Diego, MD 08/08/14 [phone]

## 2014-08-10 ENCOUNTER — Ambulatory Visit: Payer: 59 | Admitting: Cardiology

## 2014-08-13 ENCOUNTER — Telehealth (HOSPITAL_COMMUNITY): Payer: Self-pay | Admitting: *Deleted

## 2014-08-13 DIAGNOSIS — R002 Palpitations: Secondary | ICD-10-CM

## 2014-08-13 NOTE — Telephone Encounter (Signed)
Per Dr Haroldine Laws pt needs 30 day event monitor for palps, order placed

## 2014-08-14 ENCOUNTER — Encounter: Payer: Self-pay | Admitting: *Deleted

## 2014-08-14 ENCOUNTER — Encounter (INDEPENDENT_AMBULATORY_CARE_PROVIDER_SITE_OTHER): Payer: 59

## 2014-08-14 DIAGNOSIS — R002 Palpitations: Secondary | ICD-10-CM

## 2014-08-14 NOTE — Progress Notes (Signed)
Patient ID: Candice Robertson, female   DOB: 03/19/1986, 29 y.o.   MRN: [id number] [employer] 30 day cardiac event monitor applied to patient.

## 2014-08-21 LAB — OB RESULTS CONSOLE HIV ANTIBODY (ROUTINE TESTING): HIV: NONREACTIVE

## 2014-08-21 LAB — OB RESULTS CONSOLE RPR: RPR: NONREACTIVE

## 2014-08-21 LAB — OB RESULTS CONSOLE RUBELLA ANTIBODY, IGM: Rubella: IMMUNE

## 2014-08-21 LAB — OB RESULTS CONSOLE ANTIBODY SCREEN: ANTIBODY SCREEN: NEGATIVE

## 2014-08-21 LAB — OB RESULTS CONSOLE HEPATITIS B SURFACE ANTIGEN: Hepatitis B Surface Ag: NEGATIVE

## 2014-08-21 LAB — OB RESULTS CONSOLE ABO/RH: RH Type: POSITIVE

## 2014-08-27 ENCOUNTER — Ambulatory Visit: Payer: 59 | Admitting: Internal Medicine

## 2014-09-05 ENCOUNTER — Ambulatory Visit: Payer: 59 | Admitting: Cardiology

## 2014-11-29 ENCOUNTER — Encounter (HOSPITAL_COMMUNITY): Payer: Self-pay | Admitting: Emergency Medicine

## 2014-11-29 ENCOUNTER — Emergency Department (HOSPITAL_COMMUNITY)
Admission: EM | Admit: 2014-11-29 | Discharge: 2014-11-29 | Disposition: A | Discharge status: Home / Self Care | Payer: 59 | Attending: Emergency Medicine | Admitting: Emergency Medicine

## 2014-11-29 DIAGNOSIS — R197 Diarrhea, unspecified: Secondary | ICD-10-CM | POA: Diagnosis not present

## 2014-11-29 DIAGNOSIS — O9989 Other specified diseases and conditions complicating pregnancy, childbirth and the puerperium: Secondary | ICD-10-CM | POA: Diagnosis present

## 2014-11-29 DIAGNOSIS — R55 Syncope and collapse: Secondary | ICD-10-CM | POA: Insufficient documentation

## 2014-11-29 DIAGNOSIS — Z79899 Other long term (current) drug therapy: Secondary | ICD-10-CM | POA: Diagnosis not present

## 2014-11-29 DIAGNOSIS — R000 Tachycardia, unspecified: Secondary | ICD-10-CM | POA: Diagnosis not present

## 2014-11-29 DIAGNOSIS — Z8719 Personal history of other diseases of the digestive system: Secondary | ICD-10-CM | POA: Diagnosis not present

## 2014-11-29 DIAGNOSIS — Z3A23 23 weeks gestation of pregnancy: Secondary | ICD-10-CM | POA: Diagnosis not present

## 2014-11-29 DIAGNOSIS — R42 Dizziness and giddiness: Secondary | ICD-10-CM | POA: Diagnosis not present

## 2014-11-29 DIAGNOSIS — R6883 Chills (without fever): Secondary | ICD-10-CM | POA: Diagnosis not present

## 2014-11-29 DIAGNOSIS — Z87891 Personal history of nicotine dependence: Secondary | ICD-10-CM | POA: Insufficient documentation

## 2014-11-29 LAB — CBC WITH DIFFERENTIAL/PLATELET
BASOS ABS: 0 10*3/uL (ref 0.0–0.1)
Basophils Relative: 0 % (ref 0–1)
Eosinophils Absolute: 0.1 10*3/uL (ref 0.0–0.7)
Eosinophils Relative: 1 % (ref 0–5)
HCT: 35.3 % — ABNORMAL LOW (ref 36.0–46.0)
HEMOGLOBIN: 12 g/dL (ref 12.0–15.0)
Lymphocytes Relative: 26 % (ref 12–46)
Lymphs Abs: 1.8 10*3/uL (ref 0.7–4.0)
MCH: 29.5 pg (ref 26.0–34.0)
MCHC: 34 g/dL (ref 30.0–36.0)
MCV: 86.7 fL (ref 78.0–100.0)
MONO ABS: 0.3 10*3/uL (ref 0.1–1.0)
MONOS PCT: 4 % (ref 3–12)
Neutro Abs: 4.9 10*3/uL (ref 1.7–7.7)
Neutrophils Relative %: 70 % (ref 43–77)
PLATELETS: 207 10*3/uL (ref 150–400)
RBC: 4.07 MIL/uL (ref 3.87–5.11)
RDW: 13.4 % (ref 11.5–15.5)
WBC: 7 10*3/uL (ref 4.0–10.5)

## 2014-11-29 LAB — COMPREHENSIVE METABOLIC PANEL
ALK PHOS: 54 U/L (ref 38–126)
ALT: 23 U/L (ref 14–54)
AST: 24 U/L (ref 15–41)
Albumin: 3.2 g/dL — ABNORMAL LOW (ref 3.5–5.0)
Anion gap: 8 (ref 5–15)
BILIRUBIN TOTAL: 0.2 mg/dL — AB (ref 0.3–1.2)
BUN: 6 mg/dL (ref 6–20)
CHLORIDE: 107 mmol/L (ref 101–111)
CO2: 20 mmol/L — AB (ref 22–32)
Calcium: 9.2 mg/dL (ref 8.9–10.3)
Creatinine, Ser: 0.56 mg/dL (ref 0.44–1.00)
GFR calc Af Amer: >60 mL/min (ref >60)
GFR calc non Af Amer: >60 mL/min (ref >60)
Glucose, Bld: 155 mg/dL — ABNORMAL HIGH (ref 65–99)
POTASSIUM: 3.6 mmol/L (ref 3.5–5.1)
Sodium: 135 mmol/L (ref 135–145)
Total Protein: 6.7 g/dL (ref 6.5–8.1)

## 2014-11-29 LAB — URINALYSIS, ROUTINE W REFLEX MICROSCOPIC
Bilirubin Urine: NEGATIVE
Glucose, UA: NEGATIVE mg/dL
HGB URINE DIPSTICK: NEGATIVE
Ketones, ur: NEGATIVE mg/dL
Leukocytes, UA: NEGATIVE
Nitrite: NEGATIVE
Protein, ur: NEGATIVE mg/dL
Specific Gravity, Urine: 1.002 — ABNORMAL LOW (ref 1.005–1.030)
Urobilinogen, UA: 0.2 mg/dL (ref 0.0–1.0)
pH: 7 (ref 5.0–8.0)

## 2014-11-29 MED ORDER — SODIUM CHLORIDE 0.9 % IV BOLUS (SEPSIS)
1000.0000 mL | Freq: Once | INTRAVENOUS | Status: AC
  Administered 2014-11-29: 1000 mL via INTRAVENOUS

## 2014-11-29 NOTE — ED Notes (Signed)
RROB spoke with Dr [name] and told of her signs and symptoms; her v/s, lab work and interventions. He agreed with the plan of care to let pt go home; pt is to take it easy and rest thru the weekend and make an appt to follow up within a week with OB. Told pt that if she has any questions or concerns then to call office/[facility] call provider [facility] Told pt to po hydrate and to make sure eating well to keep blood sugar steady. Pt verbalized understanding.

## 2014-11-29 NOTE — ED Provider Notes (Signed)
CSN: [id number]     Arrival date & time 11/29/14  1152 History   First MD Initiated Contact with Patient 11/29/14 1153     Chief Complaint  Patient presents with  . Dizziness  . Near Syncope     (Consider location/radiation/quality/duration/timing/severity/associated sxs/prior Treatment) HPI  29 year old female who is currently [redacted] weeks pregnant with her second child presents with near-syncope. She is a Marine scientist in the cardiologist office and has been feeling lightheaded since yesterday but was significant only worse today. She did not pass out but feels like she is about to. She is felt like her heart is racing and has had this feeling in the past. I talked with her cardiologist, Dr. Sung Amabile, who has worked her up for the tachycardia and has only found sinus tach. She has not had any fevers, cough, shortness of breath, chest pain, abdominal pain, or vomiting. Has been having multiple loose watery stools. Has felt baby kicking normally today and denies any loss of fluid or vaginal bleeding. This pregnancy has been uncomplicated otherwise. Denies any urinary symptoms. Has been feeling chills and she was passed out but otherwise has not felt febrile.  Past Medical History  Diagnosis Date  . Breech presentation   . GERD (gastroesophageal reflux disease)   . PP care - s/p 1C/S - breech 5/1 10/14/2011   Past Surgical History  Procedure Laterality Date  . Tonsillectomy    . Cesarean section  10/14/2011    Procedure: CESAREAN SECTION;  Surgeon: Lovenia Kim, MD;  Location: [facility];  Service: Gynecology;  Laterality: N/A;  Primary EDD: 10/21/11  . Tonsilectomy/adenoidectomy with myringotomy  1991  . Tympanostomy tube placement  1989   Family History  Problem Relation Age of Onset  . Alcohol abuse Maternal Uncle   . Diabetes Maternal Uncle   . Heart attack Maternal Grandmother   . Depression Maternal Grandmother    History  Substance Use Topics  . Smoking status: Former Smoker    Quit date:  10/07/2009  . Smokeless tobacco: Not on file  . Alcohol Use: Yes   OB History    Gravida Para Term Preterm AB TAB SAB Ectopic Multiple Living   2 1 1       1      Review of Systems  Constitutional: Positive for chills.  Respiratory: Negative for cough and shortness of breath.   Cardiovascular: Negative for chest pain and leg swelling.  Gastrointestinal: Positive for diarrhea. Negative for vomiting and abdominal pain.  Genitourinary: Negative for dysuria.  Neurological: Positive for light-headedness. Negative for syncope.  All other systems reviewed and are negative.     Allergies  Codeine phosphate  Home Medications   Prior to Admission medications   Medication Sig Start Date End Date Taking? Authorizing Provider  acetaminophen (TYLENOL) 500 MG tablet Take 500 mg by mouth every 6 (six) hours as needed.    Historical Provider, MD  clonazePAM (KLONOPIN) 0.5 MG tablet Take 0.5-1 tablets (0.25-0.5 mg total) by mouth daily as needed for anxiety. Patient not taking: Reported on 08/08/2014 04/24/14   [name], MD  docusate sodium (COLACE) 100 MG capsule Take 100 mg by mouth as needed for mild constipation.    Historical Provider, MD  Prenatal Vit-Fe Fumarate-FA (PRENATAL MULTIVITAMIN) TABS tablet Take 1 tablet by mouth daily at 12 noon.    Historical Provider, MD   BP 147/83 mmHg  Pulse 106  Temp(Src) 98.4 F (36.9 C)  Resp 18  SpO2 100%  LMP  06/19/2014 Physical Exam  Constitutional: She is oriented to person, place, and time. She appears well-developed and well-nourished.  HENT:  Head: Normocephalic and atraumatic.  Right Ear: External ear normal.  Left Ear: External ear normal.  Nose: Nose normal.  Eyes: Right eye exhibits no discharge. Left eye exhibits no discharge.  Cardiovascular: Regular rhythm and normal heart sounds.  Tachycardia present.   No murmur heard. Pulmonary/Chest: Effort normal and breath sounds normal.  Abdominal: Soft. There is no  tenderness.  Gravid, non-tender  Musculoskeletal: She exhibits edema (mild pedal edema but no pitting edema or lower leg edema).  Neurological: She is alert and oriented to person, place, and time.  Skin: Skin is warm and dry.  Nursing note and vitals reviewed.   ED Course  Procedures (including critical care time) Labs Review Labs Reviewed  COMPREHENSIVE METABOLIC PANEL - Abnormal; Notable for the following:    CO2 20 (*)    Glucose, Bld 155 (*)    Albumin 3.2 (*)    Total Bilirubin 0.2 (*)    All other components within normal limits  CBC WITH DIFFERENTIAL/PLATELET - Abnormal; Notable for the following:    HCT 35.3 (*)    All other components within normal limits  URINALYSIS, ROUTINE W REFLEX MICROSCOPIC (NOT AT [facility]) - Abnormal; Notable for the following:    Specific Gravity, Urine 1.002 (*)    All other components within normal limits    Imaging Review No results found.   EKG Interpretation   Date/Time:  Thursday November 29 2014 11:52:24 EDT Ventricular Rate:  122 PR Interval:  139 QRS Duration: 86 QT Interval:  330 QTC Calculation: 470 R Axis:   74 Text Interpretation:  Sinus tachycardia Nonspecific repol abnormality,  diffuse leads no significant change since Feb 2016 Confirmed by Regenia Skeeter   MD, IXL ([id number]) on 11/29/2014 11:57:19 AM      MDM   Final diagnoses:  Near syncope    Patient with a near syncopal episode and recurrent sinus tachycardia. Had a discussion with her cardiologist, Dr. 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 The patient sinus tachycardia improved with IV fluids and she now feels back to baseline. She has had some diarrhea, could have some volume depletion as a cause of her symptoms. Her electrolytes and lab work is essentially unremarkable and she has no sign of proteinuria or UTI. Given she feels back to baseline, will discharge home. The rapid response OB nurse has evaluated the patient on  the fetal monitor and there have been no issues.    Sherwood Gambler, MD 11/29/14 [phone]

## 2014-11-29 NOTE — Discharge Instructions (Signed)
Near-Syncope Near-syncope (commonly known as near fainting) is sudden weakness, dizziness, or feeling like you might pass out. During an episode of near-syncope, you may also develop pale skin, have tunnel vision, or feel sick to your stomach (nauseous). Near-syncope may occur when getting up after sitting or while standing for a long time. It is caused by a sudden decrease in blood flow to the brain. This decrease can result from various causes or triggers, most of which are not serious. However, because near-syncope can sometimes be a sign of something serious, a medical evaluation is required. The specific cause is often not determined. HOME CARE INSTRUCTIONS  Monitor your condition for any changes. The following actions may help to alleviate any discomfort you are experiencing:  Have someone stay with you until you feel stable.  Lie down right away and prop your feet up if you start feeling like you might faint. Breathe deeply and steadily. Wait until all the symptoms have passed. Most of these episodes last only a few minutes. You may feel tired for several hours.   Drink enough fluids to keep your urine clear or pale yellow.   If you are taking blood pressure or heart medicine, get up slowly when seated or lying down. Take several minutes to sit and then stand. This can reduce dizziness.  Follow up with your health care provider as directed. SEEK IMMEDIATE MEDICAL CARE IF:   You have a severe headache.   You have unusual pain in the chest, abdomen, or back.   You are bleeding from the mouth or rectum, or you have black or tarry stool.   You have an irregular or very fast heartbeat.   You have repeated fainting or have seizure-like jerking during an episode.   You faint when sitting or lying down.   You have confusion.   You have difficulty walking.   You have severe weakness.   You have vision problems.  MAKE SURE YOU:   Understand these instructions.  Will  watch your condition.  Will get help right away if you are not doing well or get worse. Document Released: 06/01/2005 Document Revised: 06/06/2013 Document Reviewed: 11/04/2012 [facility] Patient Information 2015 [place], [place]. This information is not intended to replace advice given to you by your health care provider. Make sure you discuss any questions you have with your health care provider.    Nonspecific Tachycardia Tachycardia is a faster than normal heartbeat (more than 100 beats per minute). In adults, the heart normally beats between 60 and 100 times a minute. A fast heartbeat may be a normal response to exercise or stress. It does not necessarily mean that something is wrong. However, sometimes when your heart beats too fast it may not be able to pump enough blood to the rest of your body. This can result in chest pain, shortness of breath, dizziness, and even fainting. Nonspecific tachycardia means that the specific cause or pattern of your tachycardia is unknown. CAUSES  Tachycardia may be harmless or it may be due to a more serious underlying cause. Possible causes of tachycardia include:  Exercise or exertion.  Fever.  Pain or injury.  Infection.  Loss of body fluids (dehydration).  Overactive thyroid.  Lack of red blood cells (anemia).  Anxiety and stress.  Alcohol.  Caffeine.  Tobacco products.  Diet pills.  Illegal drugs.  Heart disease. SYMPTOMS  Rapid or irregular heartbeat (palpitations).  Suddenly feeling your heart beating (cardiac awareness).  Dizziness.  Tiredness (fatigue).  Shortness  of breath.  Chest pain.  Nausea.  Fainting. DIAGNOSIS  Your caregiver will perform a physical exam and take your medical history. In some cases, a heart specialist (cardiologist) may be consulted. Your caregiver may also order:  Blood tests.  Electrocardiography. This test records the electrical activity of your heart.  A heart monitoring  test. TREATMENT  Treatment will depend on the likely cause of your tachycardia. The goal is to treat the underlying cause of your tachycardia. Treatment methods may include:  Replacement of fluids or blood through an intravenous (IV) tube for moderate to severe dehydration or anemia.  New medicines or changes in your current medicines.  Diet and lifestyle changes.  Treatment for certain infections.  Stress relief or relaxation methods. HOME CARE INSTRUCTIONS   Rest.  Drink enough fluids to keep your urine clear or pale yellow.  Do not smoke.  Avoid:  Caffeine.  Tobacco.  Alcohol.  Chocolate.  Stimulants such as over-the-counter diet pills or pills that help you stay awake.  Situations that cause anxiety or stress.  Illegal drugs such as marijuana, phencyclidine (PCP), and cocaine.  Only take medicine as directed by your caregiver.  Keep all follow-up appointments as directed by your caregiver. SEEK IMMEDIATE MEDICAL CARE IF:   You have pain in your chest, upper arms, jaw, or neck.  You become weak, dizzy, or feel faint.  You have palpitations that will not go away.  You vomit, have diarrhea, or pass blood in your stool.  Your skin is cool, pale, and wet.  You have a fever that will not go away with rest, fluids, and medicine. MAKE SURE YOU:   Understand these instructions.  Will watch your condition.  Will get help right away if you are not doing well or get worse. Document Released: 07/09/2004 Document Revised: 08/24/2011 Document Reviewed: 05/12/2011 [facility] Patient Information 2015 [place], [place]. This information is not intended to replace advice given to you by your health care provider. Make sure you discuss any questions you have with your health care provider.

## 2014-11-29 NOTE — ED Notes (Signed)
Rapid ob RN at bedside.

## 2014-11-29 NOTE — ED Notes (Signed)
Pt from work for eval of sudden onset of dizziness and lightheadedness, pt states had similar episode yesterday and states that it passed after eating and drinking. Pt initial heart rate of 130 per cardiac clinic nurses. Pt alert and oriented, [redacted] weeks pregnant. nad noted.

## 2014-11-29 NOTE — ED Notes (Signed)
Pt placed in gown and in bed. Pt monitored by pulse ox,  bp cuff, and 12-lead. MD Regenia Skeeter at bedside.

## 2014-11-29 NOTE — ED Notes (Signed)
This note also relates to the following rows which could not be included: Pulse Rate - Cannot attach notes to unvalidated device data Resp - Cannot attach notes to unvalidated device data SpO2 - Cannot attach notes to unvalidated device data   RR[facility] called to come assess pt who had a near syncopal episode around 11am. Pt reports she has a placenta previa but no abdominal pain, no vaginal bleeding, no leaking of fluid. Pt reports just feeling very weak, HR spikes up when feeling faint; reports some loose stools; no nausea, no blurred vision, no RUQ pain, reports no swelling, slight "pressure headache" in frontal lobe intermittently; relieved without intervention; pt reports that she was tested in the end of March(wore a heart monitor) due to her palpitations and nothing was found, also thyroid labwork was done around Feb/March; pt says she has been eating and drinking well and when her blood sugar was checked it was 94; FHR 140,s, posititve 10x10 accels, no decels, no contractions; tracing intermittent at times due to fetal movement and early gestation

## 2014-12-11 ENCOUNTER — Ambulatory Visit (INDEPENDENT_AMBULATORY_CARE_PROVIDER_SITE_OTHER): Payer: 59 | Admitting: Internal Medicine

## 2014-12-11 VITALS — BP 140/80 | HR 100 | Ht 60.5 in | Wt 172.4 lb

## 2014-12-11 DIAGNOSIS — G909 Disorder of the autonomic nervous system, unspecified: Secondary | ICD-10-CM | POA: Diagnosis not present

## 2014-12-11 DIAGNOSIS — R55 Syncope and collapse: Secondary | ICD-10-CM

## 2014-12-11 DIAGNOSIS — R002 Palpitations: Secondary | ICD-10-CM

## 2014-12-11 NOTE — Patient Instructions (Addendum)
Medication Instructions:   Your physician recommends that you continue on your current medications as directed. Please refer to the Current Medication list given to you today.  Labwork:   Testing/Procedures:  Your physician has requested that you have an echocardiogram. Echocardiography is a painless test that uses sound waves to create images of your heart. It provides your doctor with information about the size and shape of your heart and how well your heart's chambers and valves are working. This procedure takes approximately one hour. There are no restrictions for this procedure.   Follow-Up:  In 6 to 8 weeks per Dr Lovena Le    Any Other Special Instructions Will Be Listed Below (If Applicable).  Make sure you stay hydrated and get plenty of fluids and  Watch your salt intake

## 2014-12-11 NOTE — Progress Notes (Signed)
HPI Mrs. Donnan is referred for evaluation of syncope and tachycardia. She is [redacted] weeks pregnant and has sought medical attention for episodes of near syncope. She denies frank syncope and she tells me that her pregnancy evaluations have been unremarkable. She has had documented sinus tachycardia in the 140-150 range. She has tried to eat frequent meals but notes to have occaisionally missed a meal. She has had problems with palpitations in the past.  Allergies  Allergen Reactions  . Codeine Phosphate     REACTION: nausea, vomiting, severe migraines and neck pain     Current Outpatient Prescriptions  Medication Sig Dispense Refill  . acetaminophen (TYLENOL) 500 MG tablet Take 500 mg by mouth every 6 (six) hours as needed (pain).     . busPIRone (BUSPAR) 5 MG tablet Take 5 mg by mouth 2 (two) times daily as needed (anxiety).    Marland Kitchen docusate sodium (COLACE) 100 MG capsule Take 100 mg by mouth daily as needed (constipation).     . Prenatal Vit-Fe Fumarate-FA (PRENATAL MULTIVITAMIN) TABS tablet Take 1 tablet by mouth daily at 12 noon.    . clonazePAM (KLONOPIN) 0.5 MG tablet Take 0.5-1 tablets (0.25-0.5 mg total) by mouth daily as needed for anxiety. (Patient not taking: Reported on 08/08/2014) 15 tablet 0   No current facility-administered medications for this visit.     Past Medical History  Diagnosis Date  . Breech presentation   . GERD (gastroesophageal reflux disease)   . PP care - s/p 1C/S - breech 5/1 10/14/2011  . Dizziness   . Tachycardia   . Anxiety disorder   . Hypertension   . History of chicken pox     ROS:   All systems reviewed and negative except as noted in the HPI.   Past Surgical History  Procedure Laterality Date  . Tonsillectomy    . Cesarean section  10/14/2011    Procedure: CESAREAN SECTION;  Surgeon: Lovenia Kim, MD;  Location: [facility];  Service: Gynecology;  Laterality: N/A;  Primary EDD: 10/21/11  . Tonsilectomy/adenoidectomy with myringotomy   1991  . Tympanostomy tube placement  1989     Family History  Problem Relation Age of Onset  . Alcohol abuse Maternal Uncle   . Diabetes Maternal Uncle   . Heart attack Maternal Grandmother   . Depression Maternal Grandmother      History   Social History  . Marital Status: Married    Spouse Name: N/A  . Number of Children: N/A  . Years of Education: N/A   Occupational History  . Not on file.   Social History Main Topics  . Smoking status: Former Smoker    Quit date: 10/07/2009  . Smokeless tobacco: Not on file  . Alcohol Use: Yes  . Drug Use: No  . Sexual Activity: Yes    Birth Control/ Protection: Condom, Pill   Other Topics Concern  . Not on file   Social History Narrative   1 cup of coffee in the morning. No other caffeine. No regular exercise.     BP 140/80 mmHg  Pulse 100  Ht 5' 0.5" (1.537 m)  Wt 172 lb 6.4 oz (78.2 kg)  BMI 33.10 kg/m2  LMP 06/19/2014  Physical Exam:  Well appearing gravid 29 yo woman, NAD HEENT: Unremarkable Neck:  7 cm JVD, no thyromegally Back:  No CVA tenderness Lungs:  Clear with no wheezes HEART:  Regular rate rhythm, no murmurs, no rubs, no clicks Abd:  soft, pregnant, positive bowel sounds, no organomegally, no rebound, no guarding Ext:  2 plus pulses, no edema, no cyanosis, no clubbing Skin:  No rashes no nodules Neuro:  CN II through XII intact, motor grossly intact  EKG -   Assess/Plan:

## 2014-12-13 DIAGNOSIS — R55 Syncope and collapse: Secondary | ICD-10-CM | POA: Insufficient documentation

## 2014-12-13 DIAGNOSIS — G909 Disorder of the autonomic nervous system, unspecified: Secondary | ICD-10-CM | POA: Insufficient documentation

## 2014-12-13 NOTE — Assessment & Plan Note (Signed)
I have had a discussion about dietary support, increased fluid intake and what to expect. Patients with autonomic dysfunction usually improve as pregnancy progresses.

## 2014-12-13 NOTE — Assessment & Plan Note (Signed)
I suspect her arrhythmias are sinus node dysfunction. She could have inappropriate sinus tachycardia though I suspect not.

## 2014-12-13 NOTE — Assessment & Plan Note (Signed)
The etiology of her symptoms is unclear. I suspect that she has autonomic dysfunction. I have asked her to undergo 2D echo.

## 2014-12-20 ENCOUNTER — Ambulatory Visit (HOSPITAL_COMMUNITY): Payer: 59 | Attending: Cardiovascular Disease

## 2014-12-20 ENCOUNTER — Other Ambulatory Visit: Payer: Self-pay

## 2014-12-20 DIAGNOSIS — I10 Essential (primary) hypertension: Secondary | ICD-10-CM | POA: Diagnosis not present

## 2014-12-20 DIAGNOSIS — R55 Syncope and collapse: Secondary | ICD-10-CM | POA: Insufficient documentation

## 2014-12-20 DIAGNOSIS — Z3A26 26 weeks gestation of pregnancy: Secondary | ICD-10-CM | POA: Insufficient documentation

## 2014-12-20 DIAGNOSIS — R002 Palpitations: Secondary | ICD-10-CM | POA: Diagnosis not present

## 2015-02-13 ENCOUNTER — Encounter: Payer: Self-pay | Admitting: Internal Medicine

## 2015-02-13 ENCOUNTER — Ambulatory Visit (INDEPENDENT_AMBULATORY_CARE_PROVIDER_SITE_OTHER): Payer: 59 | Admitting: Internal Medicine

## 2015-02-13 VITALS — BP 122/78 | HR 98 | Ht 60.0 in | Wt 181.0 lb

## 2015-02-13 DIAGNOSIS — R55 Syncope and collapse: Secondary | ICD-10-CM

## 2015-02-13 DIAGNOSIS — R002 Palpitations: Secondary | ICD-10-CM | POA: Diagnosis not present

## 2015-02-13 NOTE — Assessment & Plan Note (Signed)
No recurrent spells. She will undergo watchful waiting.

## 2015-02-13 NOTE — Progress Notes (Signed)
HPI Candice Robertson returns today for ongoing evaluation of  syncope and tachycardia. She is [redacted] weeks pregnant and has sought medical attention for episodes of near syncope. She denies frank syncope and she tells me that her pregnancy evaluations have been unremarkable. She has had documented sinus tachycardia in the 140-150 range. She has tried to eat frequent meals but notes to have occaisionally missed a meal. She has had problems with palpitations in the past. She was recently place on low dose Toprol and her palpitations have improved.  Allergies  Allergen Reactions  . Codeine Phosphate     REACTION: nausea, vomiting, severe migraines and neck pain     Current Outpatient Prescriptions  Medication Sig Dispense Refill  . acetaminophen (TYLENOL) 500 MG tablet Take 500 mg by mouth every 6 (six) hours as needed (pain).     . busPIRone (BUSPAR) 5 MG tablet Take 5 mg by mouth 2 (two) times daily as needed (anxiety).    . clonazePAM (KLONOPIN) 0.5 MG tablet Take 0.5-1 tablets (0.25-0.5 mg total) by mouth daily as needed for anxiety. (Patient not taking: Reported on 08/08/2014) 15 tablet 0  . docusate sodium (COLACE) 100 MG capsule Take 100 mg by mouth daily as needed (constipation).     . Prenatal Vit-Fe Fumarate-FA (PRENATAL MULTIVITAMIN) TABS tablet Take 1 tablet by mouth daily at 12 noon.     No current facility-administered medications for this visit.     Past Medical History  Diagnosis Date  . Breech presentation   . GERD (gastroesophageal reflux disease)   . PP care - s/p 1C/S - breech 5/1 10/14/2011  . Dizziness   . Tachycardia   . Anxiety disorder   . Hypertension   . History of chicken pox     ROS:   All systems reviewed and negative except as noted in the HPI.   Past Surgical History  Procedure Laterality Date  . Tonsillectomy    . Cesarean section  10/14/2011    Procedure: CESAREAN SECTION;  Surgeon: Lovenia Kim, MD;  Location: [facility];  Service: Gynecology;   Laterality: N/A;  Primary EDD: 10/21/11  . Tonsilectomy/adenoidectomy with myringotomy  1991  . Tympanostomy tube placement  1989     Family History  Problem Relation Age of Onset  . Alcohol abuse Maternal Uncle   . Diabetes Maternal Uncle   . Heart attack Maternal Grandmother   . Depression Maternal Grandmother      Social History   Social History  . Marital Status: Married    Spouse Name: N/A  . Number of Children: N/A  . Years of Education: N/A   Occupational History  . Not on file.   Social History Main Topics  . Smoking status: Former Smoker    Quit date: 10/07/2009  . Smokeless tobacco: Not on file  . Alcohol Use: Yes  . Drug Use: No  . Sexual Activity: Yes    Birth Control/ Protection: Condom, Pill   Other Topics Concern  . Not on file   Social History Narrative   1 cup of coffee in the morning. No other caffeine. No regular exercise.     LMP 06/19/2014  Physical Exam:  Well appearing gravid 29 yo woman, NAD HEENT: Unremarkable Neck:  7 cm JVD, no thyromegally Back:  No CVA tenderness Lungs:  Clear with no wheezes HEART:  Regular rate rhythm, no murmurs, no rubs, no clicks Abd:  soft, pregnant, positive bowel sounds, no organomegally, no rebound,  no guarding Ext:  2 plus pulses, no edema, no cyanosis, no clubbing Skin:  No rashes no nodules Neuro:  CN II through XII intact, motor grossly intact  EKG - nsr  Assess/Plan:

## 2015-02-13 NOTE — Patient Instructions (Signed)
Medication Instructions: - no changes  Labwork: - none  Procedures/Testing: - none  Follow-Up: - Dr. Lovena Le will see you on an as needed basis.  Any Additional Special Instructions Will Be Listed Below (If Applicable).

## 2015-02-13 NOTE — Assessment & Plan Note (Signed)
Her symptoms worsened and she was placed on Toprol by her OB. I have asked that she try to wean down her toprol by half in the days prior to delivery.

## 2015-03-01 ENCOUNTER — Other Ambulatory Visit: Payer: Self-pay | Admitting: Obstetrics and Gynecology

## 2015-03-04 ENCOUNTER — Encounter (HOSPITAL_COMMUNITY): Payer: Self-pay | Admitting: *Deleted

## 2015-03-04 NOTE — H&P (Signed)
Candice Robertson is a 29 y.o. female presenting for rpt csection due to Mild PEC.  Maternal Medical History:  Contractions: Onset was less than 1 hour ago.   Perceived severity is mild.    Fetal activity: Perceived fetal activity is normal.   Last perceived fetal movement was within the past hour.    Prenatal complications: Pre-eclampsia.   Prenatal Complications - Diabetes: none.    OB History    Gravida Para Term Preterm AB TAB SAB Ectopic Multiple Living   2 1 1       1      Past Medical History  Diagnosis Date  . Breech presentation   . GERD (gastroesophageal reflux disease)   . PP care - s/p 1C/S - breech 5/1 10/14/2011  . Dizziness   . Tachycardia     with 2016 pregancy only  . Anxiety disorder   . History of chicken pox   . Hypertension     With pregnancy only  . Anxiety    Past Surgical History  Procedure Laterality Date  . Tonsillectomy    . Cesarean section  10/14/2011    Procedure: CESAREAN SECTION;  Surgeon: Lovenia Kim, MD;  Location: [facility];  Service: Gynecology;  Laterality: N/A;  Primary EDD: 10/21/11  . Tonsilectomy/adenoidectomy with myringotomy  1991  . Tympanostomy tube placement  1989   Family History: family history includes Alcohol abuse in her maternal uncle; Depression in her maternal grandmother; Diabetes in her maternal uncle; Heart attack in her maternal grandmother. Social History:  reports that she quit smoking about 5 years ago. She has never used smokeless tobacco. She reports that she drinks alcohol. She reports that she does not use illicit drugs.   Prenatal Transfer Tool  Maternal Diabetes: No Genetic Screening: Normal Maternal Ultrasounds/Referrals: Normal Fetal Ultrasounds or other Referrals:  None Maternal Substance Abuse:  No Significant Maternal Medications:  Meds include: Other: toprol Significant Maternal Lab Results:  None Other Comments:  None  Review of Systems  Constitutional: Negative.   Eyes: Positive for blurred  vision.  Cardiovascular: Positive for palpitations.  Gastrointestinal: Negative.   Genitourinary: Negative.   Neurological: Positive for headaches.      Last menstrual period 06/19/2014. Maternal Exam:  Uterine Assessment: Contraction strength is mild.  Abdomen: Patient reports no abdominal tenderness. Surgical scars: low transverse.   Fetal presentation: vertex  Introitus: Normal vulva. Normal vagina.  Ferning test: not done.  Nitrazine test: not done. Amniotic fluid character: not assessed.  Pelvis: questionable for delivery.   Cervix: Cervix evaluated by digital exam.     Physical Exam  Nursing note and vitals reviewed. Constitutional: She appears well-developed and well-nourished.  Cardiovascular: Regular rhythm.   Occasional extrasystoles are present. Tachycardia present.   GI: Soft. Normal appearance and normal aorta.    Prenatal labs: ABO, Rh: O/Positive/-- (03/08 0000) Antibody: Negative (03/08 0000) Rubella: Immune (03/08 0000) RPR: Nonreactive (03/08 0000)  HBsAg: Negative (03/08 0000)  HIV: Non-reactive (03/08 0000)  GBS:     Assessment/Plan: 37 weeks. Previous csection. Mild PEC Maternal tacharrhythmia Proceed with rpt csection. Consent done.   TAAVON,RICHARD J 03/04/2015, 10:19 PM

## 2015-03-05 ENCOUNTER — Inpatient Hospital Stay (HOSPITAL_COMMUNITY): Payer: 59 | Admitting: Anesthesiology

## 2015-03-05 ENCOUNTER — Inpatient Hospital Stay (HOSPITAL_COMMUNITY)
Admission: AD | Admit: 2015-03-05 | Discharge: 2015-03-08 | DRG: 765 | Disposition: A | Discharge status: Home / Self Care | Payer: 59 | Source: Ambulatory Visit | Attending: Obstetrics and Gynecology | Admitting: Obstetrics and Gynecology

## 2015-03-05 ENCOUNTER — Encounter (HOSPITAL_COMMUNITY)
Admission: AD | Disposition: A | Discharge status: Home / Self Care | Payer: Self-pay | Source: Ambulatory Visit | Attending: Obstetrics and Gynecology

## 2015-03-05 ENCOUNTER — Encounter (HOSPITAL_COMMUNITY): Payer: Self-pay | Admitting: *Deleted

## 2015-03-05 DIAGNOSIS — O1403 Mild to moderate pre-eclampsia, third trimester: Secondary | ICD-10-CM | POA: Diagnosis present

## 2015-03-05 DIAGNOSIS — O3421 Maternal care for scar from previous cesarean delivery: Principal | ICD-10-CM | POA: Diagnosis present

## 2015-03-05 DIAGNOSIS — R000 Tachycardia, unspecified: Secondary | ICD-10-CM | POA: Diagnosis present

## 2015-03-05 DIAGNOSIS — Z3A37 37 weeks gestation of pregnancy: Secondary | ICD-10-CM | POA: Diagnosis present

## 2015-03-05 DIAGNOSIS — D509 Iron deficiency anemia, unspecified: Secondary | ICD-10-CM | POA: Diagnosis present

## 2015-03-05 DIAGNOSIS — D62 Acute posthemorrhagic anemia: Secondary | ICD-10-CM | POA: Diagnosis present

## 2015-03-05 DIAGNOSIS — O99214 Obesity complicating childbirth: Secondary | ICD-10-CM | POA: Diagnosis present

## 2015-03-05 DIAGNOSIS — Z6835 Body mass index (BMI) 35.0-35.9, adult: Secondary | ICD-10-CM

## 2015-03-05 DIAGNOSIS — Z8249 Family history of ischemic heart disease and other diseases of the circulatory system: Secondary | ICD-10-CM | POA: Diagnosis not present

## 2015-03-05 DIAGNOSIS — Z87891 Personal history of nicotine dependence: Secondary | ICD-10-CM

## 2015-03-05 DIAGNOSIS — E669 Obesity, unspecified: Secondary | ICD-10-CM | POA: Diagnosis present

## 2015-03-05 DIAGNOSIS — Z833 Family history of diabetes mellitus: Secondary | ICD-10-CM | POA: Diagnosis not present

## 2015-03-05 DIAGNOSIS — K219 Gastro-esophageal reflux disease without esophagitis: Secondary | ICD-10-CM | POA: Diagnosis present

## 2015-03-05 DIAGNOSIS — O9962 Diseases of the digestive system complicating childbirth: Secondary | ICD-10-CM | POA: Diagnosis present

## 2015-03-05 DIAGNOSIS — O99019 Anemia complicating pregnancy, unspecified trimester: Secondary | ICD-10-CM

## 2015-03-05 DIAGNOSIS — O9081 Anemia of the puerperium: Secondary | ICD-10-CM | POA: Diagnosis present

## 2015-03-05 DIAGNOSIS — O1400 Mild to moderate pre-eclampsia, unspecified trimester: Secondary | ICD-10-CM | POA: Diagnosis present

## 2015-03-05 HISTORY — DX: Anxiety disorder, unspecified: F41.9

## 2015-03-05 LAB — CBC
HEMATOCRIT: 36.3 % (ref 36.0–46.0)
Hemoglobin: 11.8 g/dL — ABNORMAL LOW (ref 12.0–15.0)
MCH: 27.8 pg (ref 26.0–34.0)
MCHC: 32.5 g/dL (ref 30.0–36.0)
MCV: 85.4 fL (ref 78.0–100.0)
PLATELETS: 176 10*3/uL (ref 150–400)
RBC: 4.25 MIL/uL (ref 3.87–5.11)
RDW: 14 % (ref 11.5–15.5)
WBC: 8.4 10*3/uL (ref 4.0–10.5)

## 2015-03-05 LAB — COMPREHENSIVE METABOLIC PANEL
ALT: 15 U/L (ref 14–54)
ANION GAP: 9 (ref 5–15)
AST: 18 U/L (ref 15–41)
Albumin: 3.3 g/dL — ABNORMAL LOW (ref 3.5–5.0)
Alkaline Phosphatase: 99 U/L (ref 38–126)
BILIRUBIN TOTAL: 0.3 mg/dL (ref 0.3–1.2)
BUN: 11 mg/dL (ref 6–20)
CHLORIDE: 103 mmol/L (ref 101–111)
CO2: 23 mmol/L (ref 22–32)
Calcium: 9.3 mg/dL (ref 8.9–10.3)
Creatinine, Ser: 0.43 mg/dL — ABNORMAL LOW (ref 0.44–1.00)
Glucose, Bld: 78 mg/dL (ref 65–99)
POTASSIUM: 3.9 mmol/L (ref 3.5–5.1)
Sodium: 135 mmol/L (ref 135–145)
TOTAL PROTEIN: 7 g/dL (ref 6.5–8.1)

## 2015-03-05 LAB — TYPE AND SCREEN
ABO/RH(D): O POS
ANTIBODY SCREEN: NEGATIVE

## 2015-03-05 SURGERY — Surgical Case
Anesthesia: Spinal

## 2015-03-05 MED ORDER — MORPHINE SULFATE (PF) 0.5 MG/ML IJ SOLN
INTRAMUSCULAR | Status: DC | PRN
  Administered 2015-03-05: .2 mg via INTRATHECAL

## 2015-03-05 MED ORDER — BUPIVACAINE HCL (PF) 0.25 % IJ SOLN
INTRAMUSCULAR | Status: AC
  Filled 2015-03-05: qty 10

## 2015-03-05 MED ORDER — OXYTOCIN 10 UNIT/ML IJ SOLN
INTRAMUSCULAR | Status: AC
  Filled 2015-03-05: qty 4

## 2015-03-05 MED ORDER — DIPHENHYDRAMINE HCL 25 MG PO CAPS: 25.0000 mg | ORAL_CAPSULE | Freq: Four times a day (QID) | ORAL | Status: DC | PRN

## 2015-03-05 MED ORDER — FENTANYL CITRATE (PF) 100 MCG/2ML IJ SOLN
INTRAMUSCULAR | Status: DC | PRN
  Administered 2015-03-05: 10 ug via INTRATHECAL

## 2015-03-05 MED ORDER — PROMETHAZINE HCL 25 MG/ML IJ SOLN: 6.2500 mg | INTRAMUSCULAR | Status: DC | PRN

## 2015-03-05 MED ORDER — BUPIVACAINE LIPOSOME 1.3 % IJ SUSP
20.0000 mL | Freq: Once | INTRAMUSCULAR | Status: AC
  Administered 2015-03-05: 20 mL
  Filled 2015-03-05: qty 20

## 2015-03-05 MED ORDER — EPHEDRINE SULFATE 50 MG/ML IJ SOLN
INTRAMUSCULAR | Status: DC | PRN
  Administered 2015-03-05: 10 mg via INTRAVENOUS

## 2015-03-05 MED ORDER — MENTHOL 3 MG MT LOZG: 1.0000 | LOZENGE | OROMUCOSAL | Status: DC | PRN

## 2015-03-05 MED ORDER — SENNOSIDES-DOCUSATE SODIUM 8.6-50 MG PO TABS
2.0000 | ORAL_TABLET | ORAL | Status: DC
  Administered 2015-03-06 – 2015-03-07 (×3): 2 via ORAL
  Filled 2015-03-05 (×3): qty 2

## 2015-03-05 MED ORDER — PHENYLEPHRINE 8 MG IN D5W 100 ML (0.08MG/ML) PREMIX OPTIME
INJECTION | INTRAVENOUS | Status: DC | PRN
  Administered 2015-03-05: 60 ug/min via INTRAVENOUS

## 2015-03-05 MED ORDER — SIMETHICONE 80 MG PO CHEW
80.0000 mg | CHEWABLE_TABLET | ORAL | Status: DC
  Administered 2015-03-06 – 2015-03-07 (×3): 80 mg via ORAL
  Filled 2015-03-05 (×3): qty 1

## 2015-03-05 MED ORDER — LANOLIN HYDROUS EX OINT: 1.0000 "application " | TOPICAL_OINTMENT | CUTANEOUS | Status: DC | PRN

## 2015-03-05 MED ORDER — NALBUPHINE HCL 10 MG/ML IJ SOLN
5.0000 mg | INTRAMUSCULAR | Status: DC | PRN
  Filled 2015-03-05: qty 0.5

## 2015-03-05 MED ORDER — LACTATED RINGERS IV SOLN
40.0000 [IU] | INTRAVENOUS | Status: DC | PRN
  Administered 2015-03-05: 40 [IU] via INTRAVENOUS

## 2015-03-05 MED ORDER — SODIUM CHLORIDE 0.9 % IJ SOLN: 3.0000 mL | INTRAMUSCULAR | Status: DC | PRN

## 2015-03-05 MED ORDER — PHENYLEPHRINE 8 MG IN D5W 100 ML (0.08MG/ML) PREMIX OPTIME
INJECTION | INTRAVENOUS | Status: AC
  Filled 2015-03-05: qty 100

## 2015-03-05 MED ORDER — SODIUM CHLORIDE 0.9 % IR SOLN
Status: DC | PRN
  Administered 2015-03-05: 1000 mL

## 2015-03-05 MED ORDER — DIPHENHYDRAMINE HCL 50 MG/ML IJ SOLN: 12.5000 mg | INTRAMUSCULAR | Status: DC | PRN

## 2015-03-05 MED ORDER — KETOROLAC TROMETHAMINE 30 MG/ML IJ SOLN: 30.0000 mg | Freq: Four times a day (QID) | INTRAMUSCULAR | Status: DC | PRN

## 2015-03-05 MED ORDER — KETOROLAC TROMETHAMINE 30 MG/ML IJ SOLN
INTRAMUSCULAR | Status: AC
  Administered 2015-03-05: 30 mg via INTRAMUSCULAR
  Filled 2015-03-05: qty 1

## 2015-03-05 MED ORDER — ONDANSETRON HCL 4 MG/2ML IJ SOLN
INTRAMUSCULAR | Status: AC
  Filled 2015-03-05: qty 2

## 2015-03-05 MED ORDER — SCOPOLAMINE 1 MG/3DAYS TD PT72
1.0000 | MEDICATED_PATCH | Freq: Once | TRANSDERMAL | Status: DC
  Administered 2015-03-05: 1.5 mg via TRANSDERMAL

## 2015-03-05 MED ORDER — OXYCODONE-ACETAMINOPHEN 5-325 MG PO TABS: 2.0000 | ORAL_TABLET | ORAL | Status: DC | PRN

## 2015-03-05 MED ORDER — ACETAMINOPHEN 325 MG PO TABS
650.0000 mg | ORAL_TABLET | ORAL | Status: DC | PRN
  Administered 2015-03-06: 650 mg via ORAL
  Filled 2015-03-05: qty 2

## 2015-03-05 MED ORDER — DIPHENHYDRAMINE HCL 25 MG PO CAPS: 25.0000 mg | ORAL_CAPSULE | ORAL | Status: DC | PRN

## 2015-03-05 MED ORDER — NALBUPHINE HCL 10 MG/ML IJ SOLN
5.0000 mg | Freq: Once | INTRAMUSCULAR | Status: DC | PRN
  Filled 2015-03-05: qty 0.5

## 2015-03-05 MED ORDER — BUPIVACAINE HCL (PF) 0.25 % IJ SOLN
INTRAMUSCULAR | Status: DC | PRN
  Administered 2015-03-05: 10 mL

## 2015-03-05 MED ORDER — METHYLERGONOVINE MALEATE 0.2 MG PO TABS: 0.2000 mg | ORAL_TABLET | ORAL | Status: DC | PRN

## 2015-03-05 MED ORDER — OXYTOCIN 40 UNITS IN LACTATED RINGERS INFUSION - SIMPLE MED: 62.5000 mL/h | INTRAVENOUS | Status: AC

## 2015-03-05 MED ORDER — NALOXONE HCL 0.4 MG/ML IJ SOLN: 0.4000 mg | INTRAMUSCULAR | Status: DC | PRN

## 2015-03-05 MED ORDER — ONDANSETRON HCL 4 MG/2ML IJ SOLN
INTRAMUSCULAR | Status: DC | PRN
  Administered 2015-03-05: 4 mg via INTRAVENOUS

## 2015-03-05 MED ORDER — MORPHINE SULFATE (PF) 0.5 MG/ML IJ SOLN
INTRAMUSCULAR | Status: AC
  Filled 2015-03-05: qty 100

## 2015-03-05 MED ORDER — SIMETHICONE 80 MG PO CHEW: 80.0000 mg | CHEWABLE_TABLET | ORAL | Status: DC | PRN

## 2015-03-05 MED ORDER — TETANUS-DIPHTH-ACELL PERTUSSIS 5-2.5-18.5 LF-MCG/0.5 IM SUSP: 0.5000 mL | Freq: Once | INTRAMUSCULAR | Status: DC

## 2015-03-05 MED ORDER — MEPERIDINE HCL 25 MG/ML IJ SOLN: 6.2500 mg | INTRAMUSCULAR | Status: DC | PRN

## 2015-03-05 MED ORDER — ONDANSETRON HCL 4 MG/2ML IJ SOLN: 4.0000 mg | Freq: Three times a day (TID) | INTRAMUSCULAR | Status: DC | PRN

## 2015-03-05 MED ORDER — CITRIC ACID-SODIUM CITRATE 334-500 MG/5ML PO SOLN
ORAL | Status: AC
  Filled 2015-03-05: qty 15

## 2015-03-05 MED ORDER — ACETAMINOPHEN 500 MG PO TABS
1000.0000 mg | ORAL_TABLET | Freq: Four times a day (QID) | ORAL | Status: AC
  Administered 2015-03-05: 1000 mg via ORAL
  Filled 2015-03-05 (×2): qty 2

## 2015-03-05 MED ORDER — PRENATAL MULTIVITAMIN CH
1.0000 | ORAL_TABLET | Freq: Every day | ORAL | Status: DC
  Administered 2015-03-06 – 2015-03-07 (×2): 1 via ORAL
  Filled 2015-03-05 (×2): qty 1

## 2015-03-05 MED ORDER — OXYCODONE-ACETAMINOPHEN 5-325 MG PO TABS
1.0000 | ORAL_TABLET | ORAL | Status: DC | PRN
  Administered 2015-03-06 (×2): 0.5 via ORAL
  Administered 2015-03-07: 1 via ORAL
  Administered 2015-03-07: 0.5 via ORAL
  Administered 2015-03-07 (×2): 1 via ORAL
  Administered 2015-03-07: 0.5 via ORAL
  Administered 2015-03-08: 1 via ORAL
  Filled 2015-03-05 (×8): qty 1

## 2015-03-05 MED ORDER — DIBUCAINE 1 % RE OINT: 1.0000 "application " | TOPICAL_OINTMENT | RECTAL | Status: DC | PRN

## 2015-03-05 MED ORDER — WITCH HAZEL-GLYCERIN EX PADS
1.0000 | MEDICATED_PAD | CUTANEOUS | Status: DC | PRN
Start: 2015-03-05 — End: 2015-03-08

## 2015-03-05 MED ORDER — FENTANYL CITRATE (PF) 100 MCG/2ML IJ SOLN
INTRAMUSCULAR | Status: AC
  Filled 2015-03-05: qty 4

## 2015-03-05 MED ORDER — IBUPROFEN 600 MG PO TABS
600.0000 mg | ORAL_TABLET | Freq: Four times a day (QID) | ORAL | Status: DC
  Administered 2015-03-06 – 2015-03-08 (×10): 600 mg via ORAL
  Filled 2015-03-05 (×10): qty 1

## 2015-03-05 MED ORDER — CITRIC ACID-SODIUM CITRATE 334-500 MG/5ML PO SOLN
30.0000 mL | Freq: Once | ORAL | Status: AC
  Administered 2015-03-05: 30 mL via ORAL

## 2015-03-05 MED ORDER — BUPIVACAINE IN DEXTROSE 0.75-8.25 % IT SOLN
INTRATHECAL | Status: DC | PRN
  Administered 2015-03-05: 1.6 mL via INTRATHECAL

## 2015-03-05 MED ORDER — ZOLPIDEM TARTRATE 5 MG PO TABS: 5.0000 mg | ORAL_TABLET | Freq: Every evening | ORAL | Status: DC | PRN

## 2015-03-05 MED ORDER — EPHEDRINE 5 MG/ML INJ
INTRAVENOUS | Status: AC
Start: 2015-03-05 — End: 2015-03-05
  Filled 2015-03-05: qty 10

## 2015-03-05 MED ORDER — METHYLERGONOVINE MALEATE 0.2 MG/ML IJ SOLN: 0.2000 mg | INTRAMUSCULAR | Status: DC | PRN

## 2015-03-05 MED ORDER — LACTATED RINGERS IV SOLN
INTRAVENOUS | Status: DC
  Administered 2015-03-06: 125 mL/h via INTRAVENOUS

## 2015-03-05 MED ORDER — SCOPOLAMINE 1 MG/3DAYS TD PT72
MEDICATED_PATCH | TRANSDERMAL | Status: AC
  Administered 2015-03-05: 1.5 mg via TRANSDERMAL
  Filled 2015-03-05: qty 1

## 2015-03-05 MED ORDER — LACTATED RINGERS IV SOLN
INTRAVENOUS | Status: DC
  Administered 2015-03-05 (×4): via INTRAVENOUS

## 2015-03-05 MED ORDER — SIMETHICONE 80 MG PO CHEW
80.0000 mg | CHEWABLE_TABLET | Freq: Three times a day (TID) | ORAL | Status: DC
  Administered 2015-03-05 – 2015-03-08 (×7): 80 mg via ORAL
  Filled 2015-03-05 (×7): qty 1

## 2015-03-05 MED ORDER — NALOXONE HCL 1 MG/ML IJ SOLN
1.0000 ug/kg/h | INTRAVENOUS | Status: DC | PRN
  Filled 2015-03-05: qty 2

## 2015-03-05 MED ORDER — CEFAZOLIN SODIUM-DEXTROSE 2-3 GM-% IV SOLR
2.0000 g | INTRAVENOUS | Status: AC
  Administered 2015-03-05: 2 g via INTRAVENOUS

## 2015-03-05 MED ORDER — CEFAZOLIN SODIUM-DEXTROSE 2-3 GM-% IV SOLR
INTRAVENOUS | Status: AC
  Filled 2015-03-05: qty 50

## 2015-03-05 SURGICAL SUPPLY — 36 items
BENZOIN TINCTURE PRP APPL 2/3 (GAUZE/BANDAGES/DRESSINGS) ×3 IMPLANT
CLAMP CORD UMBIL (MISCELLANEOUS) IMPLANT
CLOSURE WOUND 1/2 X4 (GAUZE/BANDAGES/DRESSINGS) ×1
CLOTH BEACON ORANGE TIMEOUT ST (SAFETY) ×3 IMPLANT
CONTAINER PREFILL 10% NBF 15ML (MISCELLANEOUS) IMPLANT
DECANTER SPIKE VIAL GLASS SM (MISCELLANEOUS) ×3 IMPLANT
DRAPE SHEET LG 3/4 BI-LAMINATE (DRAPES) IMPLANT
DRSG OPSITE POSTOP 4X10 (GAUZE/BANDAGES/DRESSINGS) ×3 IMPLANT
DURAPREP 26ML APPLICATOR (WOUND CARE) ×3 IMPLANT
ELECT REM PT RETURN 9FT ADLT (ELECTROSURGICAL) ×3
ELECTRODE REM PT RTRN 9FT ADLT (ELECTROSURGICAL) ×1 IMPLANT
EXTRACTOR VACUUM M CUP 4 TUBE (SUCTIONS) IMPLANT
EXTRACTOR VACUUM M CUP 4' TUBE (SUCTIONS)
GLOVE BIO SURGEON STRL SZ7.5 (GLOVE) ×3 IMPLANT
GOWN STRL REUS W/TWL LRG LVL3 (GOWN DISPOSABLE) ×6 IMPLANT
KIT ABG SYR 3ML LUER SLIP (SYRINGE) IMPLANT
NEEDLE HYPO 22GX1.5 SAFETY (NEEDLE) ×3 IMPLANT
NEEDLE HYPO 25X5/8 SAFETYGLIDE (NEEDLE) IMPLANT
NEEDLE SPNL 20GX3.5 QUINCKE YW (NEEDLE) IMPLANT
NS IRRIG 1000ML POUR BTL (IV SOLUTION) ×3 IMPLANT
PACK C SECTION WH (CUSTOM PROCEDURE TRAY) ×3 IMPLANT
PENCIL SMOKE EVAC W/HOLSTER (ELECTROSURGICAL) ×3 IMPLANT
STRIP CLOSURE SKIN 1/2X4 (GAUZE/BANDAGES/DRESSINGS) ×2 IMPLANT
SUT MNCRL 0 VIOLET CTX 36 (SUTURE) ×2 IMPLANT
SUT MNCRL AB 3-0 PS2 27 (SUTURE) ×3 IMPLANT
SUT MON AB 2-0 CT1 27 (SUTURE) ×3 IMPLANT
SUT MON AB-0 CT1 36 (SUTURE) ×6 IMPLANT
SUT MONOCRYL 0 CTX 36 (SUTURE) ×4
SUT PLAIN 0 NONE (SUTURE) IMPLANT
SUT PLAIN 2 0 (SUTURE)
SUT PLAIN 2 0 XLH (SUTURE) ×3 IMPLANT
SUT PLAIN ABS 2-0 CT1 27XMFL (SUTURE) IMPLANT
SYR 20CC LL (SYRINGE) IMPLANT
SYR CONTROL 10ML LL (SYRINGE) ×3 IMPLANT
TOWEL OR 17X24 6PK STRL BLUE (TOWEL DISPOSABLE) ×3 IMPLANT
TRAY FOLEY CATH SILVER 14FR (SET/KITS/TRAYS/PACK) ×3 IMPLANT

## 2015-03-05 NOTE — Progress Notes (Signed)
Patient ID: Candice Robertson, female   DOB: Dec 27, 1985, 29 y.o.   MRN: [id number] Patient seen and examined. Consent witnessed and signed. No changes noted. Update completed. CBC    Component Value Date/Time   WBC 8.4 03/05/2015 1115   RBC 4.25 03/05/2015 1115   HGB 11.8* 03/05/2015 1115   HCT 36.3 03/05/2015 1115   PLT 176 03/05/2015 1115   MCV 85.4 03/05/2015 1115   MCH 27.8 03/05/2015 1115   MCHC 32.5 03/05/2015 1115   RDW 14.0 03/05/2015 1115   LYMPHSABS 1.8 11/29/2014 1156   MONOABS 0.3 11/29/2014 1156   EOSABS 0.1 11/29/2014 1156   BASOSABS 0.0 11/29/2014 1156

## 2015-03-05 NOTE — Transfer of Care (Signed)
Immediate Anesthesia Transfer of Care Note  Patient: Candice Robertson  Procedure(s) Performed: Procedure(s) with comments: Repeat CESAREAN SECTION (N/A) - EDD: 03/26/15   Patient Location: PACU  Anesthesia Type:Spinal  Level of Consciousness: awake, alert  and oriented  Airway & Oxygen Therapy: Patient Spontanous Breathing  Post-op Assessment: Report given to RN and Post -op Vital signs reviewed and stable  Post vital signs: Reviewed and stable  Last Vitals:  Filed Vitals:   03/05/15 1218  BP: 140/85  Pulse: 98  Temp: 36.3 C  Resp: 20    Complications: No apparent anesthesia complications

## 2015-03-05 NOTE — Anesthesia Postprocedure Evaluation (Signed)
  Anesthesia Post-op Note  Patient: Candice Robertson  Procedure(s) Performed: Procedure(s) with comments: Repeat CESAREAN SECTION (N/A) - EDD: 03/26/15   Patient Location: PACU  Anesthesia Type:Spinal  Level of Consciousness: awake, alert , oriented and patient cooperative  Airway and Oxygen Therapy: Patient Spontanous Breathing  Post-op Pain: mild  Post-op Assessment: Post-op Vital signs reviewed, Patient's Cardiovascular Status Stable, Respiratory Function Stable, Patent Airway, No signs of Nausea or vomiting, Pain level controlled, No headache, No backache, Spinal receding and Patient able to bend at knees LLE Motor Response: Purposeful movement LLE Sensation: Increased RLE Motor Response: Purposeful movement RLE Sensation: Increased      Post-op Vital Signs: Reviewed and stable  Last Vitals:  Filed Vitals:   03/05/15 1500  BP: 119/65  Pulse: 96  Temp: 36.6 C  Resp: 25    Complications: No apparent anesthesia complications

## 2015-03-05 NOTE — Consult Note (Signed)
[facility]  Delivery Note:  C-section       03/05/2015  1:44 PM  I was called to the operating room at the request of the patient's obstetrician (Dr. [name]) for a repeat c-section.  PRENATAL HX:  29 y/o G2P1001 at 12 and 0/[redacted] weeks gestation. Pregnancy complicated by mild preeclampsia, necessitating delivery at 37 weeks.    INTRAPARTUM HX:   Repeat c-section with AROM at delivery  DELIVERY:  Infant was vigorous at delivery, requiring no resuscitation other than standard warming, drying and stimulation.  APGARs 8 and 9.  Exam within normal limits.  After 5 minutes, baby left with nurse to assist parents with skin-to-skin care.   _____________________ Electronically Signed By: Clinton Gallant, MD Neonatologist

## 2015-03-05 NOTE — Op Note (Signed)
Cesarean Section Procedure Note  Indications: previous uterine incision kerr x one and Mild Preeclampsia  Pre-operative Diagnosis: 37 week 0 day pregnancy.  Post-operative Diagnosis: same  Surgeon: Lovenia Kim   Assistants: Drake Leach, CNM  Anesthesia: Local anesthesia 0.25.% bupivacaine and Spinal anesthesia  ASA Class: 2  Procedure Details  The patient was seen in the Holding Room. The risks, benefits, complications, treatment options, and expected outcomes were discussed with the patient.  The patient concurred with the proposed plan, giving informed consent. The risks of anesthesia, infection, bleeding and possible injury to other organs discussed. Injury to bowel, bladder, or ureter with possible need for repair discussed. Possible need for transfusion with secondary risks of hepatitis or HIV acquisition discussed. Post operative complications to include but not limited to DVT, PE and Pneumonia noted. The site of surgery properly noted/marked. The patient was taken to Operating Room # 1, identified as Candice Robertson and the procedure verified as C-Section Delivery. A Time Out was held and the above information confirmed.  After induction of anesthesia, the patient was draped and prepped in the usual sterile manner. A Pfannenstiel incision was made and carried down through the subcutaneous tissue to the fascia. Fascial incision was made and extended transversely using Mayo scissors. The fascia was separated from the underlying rectus tissue superiorly and inferiorly. The peritoneum was identified and entered. Peritoneal incision was extended longitudinally. The utero-vesical peritoneal reflection was incised transversely and the bladder flap was bluntly freed from the lower uterine segment. A low transverse uterine incision(Kerr hysterotomy) was made. Delivered from OA presentation with vacuum assistance was a  female with Apgar scores of 8 at one minute and 9 at five minutes. Bulb  suctioning gently performed. Neonatal team in attendance.After the umbilical cord was clamped and cut cord blood was obtained for evaluation. The placenta was removed intact and appeared normal. The uterus was curetted with a dry lap pack. Good hemostasis was noted.The uterine outline, tubes and ovaries appeared normal. The uterine incision was closed with running locked sutures of 0 Monocryl x 2 layers. Hemostasis was observed. The parietal peritoneum was closed with a running 2-0 Monocryl suture. The fascia was then reapproximated with running sutures of 0 Monocryl. The skin was reapproximated with 3-0 monocryl after [place] closure with 2-0 plain.  Instrument, sponge, and needle counts were correct prior the abdominal closure and at the conclusion of the case.   Findings: FLTF, OA deflexed, posterior placenta  Estimated Blood Loss:  600         Drains: foley                 Specimens: placenta                  Complications:  None; patient tolerated the procedure well.         Disposition: PACU - hemodynamically stable.         Condition: stable  Attending Attestation: I performed the procedure.

## 2015-03-05 NOTE — Anesthesia Procedure Notes (Signed)
Spinal Patient location during procedure: OR Staffing Anesthesiologist: Catalina Gravel Performed by: anesthesiologist  Preanesthetic Checklist Completed: patient identified, surgical consent, pre-op evaluation, timeout performed, IV checked, risks and benefits discussed and monitors and equipment checked Spinal Block Patient position: sitting Prep: site prepped and draped and DuraPrep Patient monitoring: continuous pulse ox and blood pressure Approach: midline Location: L3-4 Injection technique: single-shot Needle Needle type: Pencan  Needle gauge: 24 G Additional Notes Functioning IV was confirmed and monitors were applied. Sterile prep and drape, including hand hygiene, mask and sterile gloves were used. The patient was positioned and the spine was prepped. The skin was anesthetized with lidocaine.  Free flow of clear CSF was obtained prior to injecting local anesthetic into the CSF.  The spinal needle aspirated freely following injection.  The needle was carefully withdrawn.  The patient tolerated the procedure well. Consent was obtained prior to procedure with all questions answered and concerns addressed. Risks including but not limited to bleeding, infection, nerve damage, paralysis, failed block, inadequate analgesia, allergic reaction, high spinal, itching and headache were discussed and the patient wished to proceed.   Hoy Morn, MD

## 2015-03-05 NOTE — Anesthesia Preprocedure Evaluation (Addendum)
Anesthesia Evaluation  Patient identified by MRN, date of birth, ID band Patient awake    Reviewed: Allergy & Precautions, NPO status , Patient's Chart, lab work & pertinent test results, reviewed documented beta blocker date and time   History of Anesthesia Complications Negative for: history of anesthetic complications  Airway Mallampati: II  TM Distance: >3 FB Neck ROM: Full    Dental  (+) Teeth Intact, Dental Advisory Given   Pulmonary former smoker,    Pulmonary exam normal breath sounds clear to auscultation       Cardiovascular Exercise Tolerance: Good hypertension, Pt. on medications and Pt. on home beta blockers (-) angina(-) Past MI Normal cardiovascular exam Rhythm:Regular Rate:Normal     Neuro/Psych PSYCHIATRIC DISORDERS Anxiety negative neurological ROS     GI/Hepatic Neg liver ROS, GERD  Medicated,  Endo/Other  Obesity   Renal/GU negative Renal ROS     Musculoskeletal negative musculoskeletal ROS (+)   Abdominal   Peds  Hematology  (+) Blood dyscrasia, anemia ,   Anesthesia Other Findings Day of surgery medications reviewed with the patient.  Reproductive/Obstetrics (+) Pregnancy                            Anesthesia Physical Anesthesia Plan  ASA: III  Anesthesia Plan: Spinal   Post-op Pain Management:    Induction:   Airway Management Planned:   Additional Equipment:   Intra-op Plan:   Post-operative Plan:   Informed Consent: I have reviewed the patients History and Physical, chart, labs and discussed the procedure including the risks, benefits and alternatives for the proposed anesthesia with the patient or authorized representative who has indicated his/her understanding and acceptance.   Dental advisory given  Plan Discussed with: CRNA, Anesthesiologist and Surgeon  Anesthesia Plan Comments: (Discussed risks and benefits of and differences between  spinal and general. Discussed risks of spinal including headache, backache, failure, bleeding, infection, and nerve damage. Patient consents to spinal. Questions answered. Coagulation studies and platelet count acceptable.)        Anesthesia Quick Evaluation

## 2015-03-06 ENCOUNTER — Encounter (HOSPITAL_COMMUNITY): Payer: Self-pay | Admitting: Obstetrics and Gynecology

## 2015-03-06 ENCOUNTER — Inpatient Hospital Stay (HOSPITAL_COMMUNITY)
Admission: RE | Admit: 2015-03-06 | Discharge: 2015-03-06 | Disposition: A | Discharge status: Home / Self Care | Payer: 59 | Source: Ambulatory Visit

## 2015-03-06 LAB — CBC
HEMATOCRIT: 26.4 % — AB (ref 36.0–46.0)
HEMOGLOBIN: 8.7 g/dL — AB (ref 12.0–15.0)
MCH: 27.9 pg (ref 26.0–34.0)
MCHC: 33 g/dL (ref 30.0–36.0)
MCV: 84.6 fL (ref 78.0–100.0)
Platelets: 149 10*3/uL — ABNORMAL LOW (ref 150–400)
RBC: 3.12 MIL/uL — ABNORMAL LOW (ref 3.87–5.11)
RDW: 14 % (ref 11.5–15.5)
WBC: 9.6 10*3/uL (ref 4.0–10.5)

## 2015-03-06 LAB — BIRTH TISSUE RECOVERY COLLECTION (PLACENTA DONATION)

## 2015-03-06 LAB — RPR: RPR: NONREACTIVE

## 2015-03-06 MED ORDER — FERROUS SULFATE 325 (65 FE) MG PO TABS
325.0000 mg | ORAL_TABLET | Freq: Two times a day (BID) | ORAL | Status: DC
  Administered 2015-03-06 – 2015-03-08 (×4): 325 mg via ORAL
  Filled 2015-03-06 (×4): qty 1

## 2015-03-06 NOTE — Progress Notes (Signed)
CLINICAL SOCIAL WORK MATERNAL/CHILD NOTE  Patient Details  Name: Girl Anyela Napierkowski MRN: [id number] Date of Birth: 03/05/2015  Date:  03/06/2015  Clinical Social Worker Initiating Note:  Lucita Ferrara, LCSW and Trevor Iha, Arita Miss,  MSW intern  Date/ Time Initiated:  03/06/15/0900     Child's Name:  Alfredia Ferguson    Legal Guardian: Jinny Blossom (MOB) and Tommi Rumps (FOB)    Need for Interpreter:  None   Date of Referral:  03/05/15     Reason for Referral: History of anxiety   Referral Source:  [facility]   Address:  [address] [place], [place] [address]  Phone number:  [phone]   Household Members:  Minor Children, Significant Other   Natural Supports (not living in the home):  Friends, Immediate Family, Spouse/significant other   Professional Supports: None   Employment: Full-time   Type of Work: Marine scientist at [employer]    Education:  Engineer, maintenance Resources:  Multimedia programmer   Other Resources:      Cultural/Religious Considerations Which May Impact Care:  None reported   Strengths:  Ability to meet basic needs , Home prepared for child , Pediatrician chosen    Risk Factors/Current Problems:  None   Cognitive State:  Alert , Goal Oriented , Linear Thinking    Mood/Affect:  Happy , Interested , Bright , Calm , Comfortable , Relaxed    CSW Assessment:  CSW and MSW intern presented in patient's room due to a consult placed for history of anxiety. FOB was present in the room during the assessment. MOB reported she had a three year old daughter and that she had came to visit the newborn last night. Per MOB, her daughter was excited to be a big sister and has coped well with the transition better than anticipated. MOB also stated the three-year old is currently in day care at [facility]. MOB voiced feeling prepared to bring the newborn home and having all the required necessities. MOB stated she will be taking 12 weeks of maternity leave and FOB  stated he will be taking 2 weeks off. MOB voiced having a good support system from both sides of the family. Per MOB, her mother stays with her during the night while FOB attends to their three-year old at home during their hospital stay.   CSW inquired about MOB's history of anxiety. Per chart review, MOB presents with history of anxiety since 2012.  MOB stated she will have anxiety attacks about 1-2 times a year with no known triggers. MOB stated that she previously has taken anxiety medications PRN with onset anxiety attacks. MOB stated she didn't have any major anxiety symptoms during the pregnancy, and expressed feelings of confidence as she prepares to transition home since she no longer is a first-time mother.  Per MOB, most of the anxiety came from her cardiac issues during the pregnancy and being put on bed rest. MOB stated her MD told her the pain and cardiac problems would diminish once the baby was born. MOB reported that once the infant was born she immediately started to feel better and her blood pressure was back to normal. Per MOB, she will be staying an extra day at the hospital for monitoring.   MSW intern provided education on perinatal mood disorders. CSW  provided MOB with information on the support group "Feelings After Birth" and educated MOB further on the symptoms to look out for in regard to perinatal mood disorders and her history  of anxiety. MOB was grateful for the information but denied having any further questions or concerns. MOB agreed to contact CSW  if needs arise.   CSW Plan/Description:  CSW provided education on perinatal mood disorders   No Further Intervention Required/No Barriers to Discharge    Trevor Iha, Student-SW 03/06/2015, 9:36 AM

## 2015-03-06 NOTE — Progress Notes (Signed)
POD#1 Repeat C/S with mild PEC at 37 wks S:   Feels well, no PEC Sx/pain controled/tolerating po well/N/V none/Ambulating well/Voiding well /Flatus not yet  Breastfeeding  Newborn doing very well  O: A&O x 3/ Filed Vitals:   03/06/15 0824  BP: 110/60  Pulse: 80  Temp: 98.5 F (36.9 C)  Resp: 16     Labs: Lab Results  Component Value Date   WBC 9.6 03/06/2015   HGB 8.7* 03/06/2015   HCT 26.4* 03/06/2015   MCV 84.6 03/06/2015   PLT 149* 03/06/2015      Intake/Output Summary (Last 24 hours) at 03/06/15 1030 Last data filed at 03/06/15 0745  Gross per 24 hour  Intake   3671 ml  Output   3120 ml  Net    551 ml     Lungs: clear  Heart: rcr  Abdomen: soft/Bowel sounds: present/UH 0/1 firm/Dressing: dry/Insicion: intact  Lochia: normal  Extremities: normal, no oedema           no calf pain/tenderness                         A/P: POD#1 Repeat C/S/G2P2001  Mild PEC currently ASxic with normal BP.  Good diuresis.  Mild decrease in Plts to 149.  Anemia with Hb 8.7.  FeSO4.  Continue routine postop orders.  Repeat CBC/CMP tomorrow am.  D/C tomorrow or Friday.  Princess Bruins MD 03/06/2015 at 11:01 am

## 2015-03-06 NOTE — Addendum Note (Signed)
Addendum  created 03/06/15 1020 by Jonna Munro, CRNA   Modules edited: Notes Section   Notes Section:  File: [id number]

## 2015-03-06 NOTE — Anesthesia Postprocedure Evaluation (Signed)
  Anesthesia Post-op Note  Patient: Candice Robertson  Procedure(s) Performed: Procedure(s) with comments: Repeat CESAREAN SECTION (N/A) - EDD: 03/26/15   Patient Location: Mother/Baby  Anesthesia Type:Spinal  Level of Consciousness: awake, alert  and oriented  Airway and Oxygen Therapy: Patient Spontanous Breathing  Post-op Pain: none  Post-op Assessment: Post-op Vital signs reviewed, Patient's Cardiovascular Status Stable, Respiratory Function Stable, Patent Airway, No signs of Nausea or vomiting, Adequate PO intake, Pain level controlled, No headache and No backache      Post-op Vital Signs: Reviewed and stable  Last Vitals:  Filed Vitals:   03/06/15 0824  BP: 110/60  Pulse: 80  Temp: 36.9 C  Resp: 16    Complications: No apparent anesthesia complications

## 2015-03-06 NOTE — Lactation Note (Signed)
This note was copied from the chart of Candice Erleen Egner. Lactation Consultation Note   Initial consultation for 55 hour old infant born at [redacted] weeks gestation. Infant has been feeding well with 10 BF for 10-40 minutes each,  5 voids, and 1 stool in last 24 hours. Mom has large breasts with compressible tissue,  and large everted nipples. Mom had difficulty with 1st child who would not latch and lost weight after birth, mom pumped and the milk supply dwindled around 4 months. Mom is anxious about supply. Decided to begin DEBP as infant is 37 weeks and 6 lb 4.5 oz. Pump set up and explained. Mom also reported she has had a drop in her Hgb level and is to begin taking iron supplementation. Discussed with mom and grandmother that infant is a early term infant and that she needs decreased stimulation to be able to eat effectively and conserve calories for eating, growing and temperature regulation. Enc. Mom to feed infant 8-12 x in 24 hours and to awaken to feed at 3 hours as needed. Enc. To feed infant at breast followed by double pumping for 10-15 minutes and then hand expressing. Mom was able to return hand expression demonstration and gtts of colostrum noted. Infant fed consistantly with occasional stimulation needed to maintain sucking pattern, audible swallows were heard. Mom aware of STS, using pillows for support, awakening infant when sleepy, and breast massage/compression with feedings. Discussed with mom that if any BM obtained by hand expression or pumping we would want to give to baby via curved syringe and finger feeding or by spoon feeding. [facility] Brochure given with [facility] Phone #, informed mom of BF Resources, Support Groups, and OP services. Enc. Mom to call prn questions concerns.   Patient Name: Candice Robertson [id number]'T Date: 03/06/2015 Reason for consult: Initial assessment;Infant < 6lbs;Late preterm infant   Maternal Data Formula Feeding for Exclusion: No Has patient been taught Hand  Expression?: Yes Does the patient have breastfeeding experience prior to this delivery?: Yes  Feeding Feeding Type: Breast Fed Length of feed: 15 min  LATCH Score/Interventions Latch: Grasps breast easily, tongue down, lips flanged, rhythmical sucking.  Audible Swallowing: Spontaneous and intermittent  Type of Nipple: Everted at rest and after stimulation  Comfort (Breast/Nipple): Soft / non-tender     Hold (Positioning): No assistance needed to correctly position infant at breast.  LATCH Score: 10  Lactation Tools Discussed/Used Tools: Pump Breast pump type: Double-Electric Breast Pump WIC Program: No Pump Review: Setup, frequency, and cleaning;Milk Storage Initiated by:: Nonah Mattes, RN, IBCLC Date initiated:: 03/06/15   Consult Status Consult Status: Follow-up    Donn Pierini 03/06/2015, 5:17 PM

## 2015-03-07 DIAGNOSIS — O99019 Anemia complicating pregnancy, unspecified trimester: Secondary | ICD-10-CM

## 2015-03-07 DIAGNOSIS — O1400 Mild to moderate pre-eclampsia, unspecified trimester: Secondary | ICD-10-CM | POA: Diagnosis present

## 2015-03-07 DIAGNOSIS — D509 Iron deficiency anemia, unspecified: Secondary | ICD-10-CM | POA: Diagnosis present

## 2015-03-07 DIAGNOSIS — D62 Acute posthemorrhagic anemia: Secondary | ICD-10-CM | POA: Diagnosis not present

## 2015-03-07 LAB — COMPREHENSIVE METABOLIC PANEL
ALBUMIN: 2.4 g/dL — AB (ref 3.5–5.0)
ALK PHOS: 78 U/L (ref 38–126)
ALT: 15 U/L (ref 14–54)
AST: 25 U/L (ref 15–41)
Anion gap: 6 (ref 5–15)
BILIRUBIN TOTAL: 0.3 mg/dL (ref 0.3–1.2)
BUN: 6 mg/dL (ref 6–20)
CALCIUM: 8.5 mg/dL — AB (ref 8.9–10.3)
CO2: 26 mmol/L (ref 22–32)
Chloride: 107 mmol/L (ref 101–111)
Creatinine, Ser: 0.49 mg/dL (ref 0.44–1.00)
GFR calc Af Amer: >60 mL/min (ref >60)
GFR calc non Af Amer: >60 mL/min (ref >60)
GLUCOSE: 108 mg/dL — AB (ref 65–99)
Potassium: 4 mmol/L (ref 3.5–5.1)
Sodium: 139 mmol/L (ref 135–145)
TOTAL PROTEIN: 5.2 g/dL — AB (ref 6.5–8.1)

## 2015-03-07 LAB — CBC
HEMATOCRIT: 26.5 % — AB (ref 36.0–46.0)
HEMOGLOBIN: 8.6 g/dL — AB (ref 12.0–15.0)
MCH: 28 pg (ref 26.0–34.0)
MCHC: 32.5 g/dL (ref 30.0–36.0)
MCV: 86.3 fL (ref 78.0–100.0)
Platelets: 164 10*3/uL (ref 150–400)
RBC: 3.07 MIL/uL — AB (ref 3.87–5.11)
RDW: 14 % (ref 11.5–15.5)
WBC: 7.7 10*3/uL (ref 4.0–10.5)

## 2015-03-07 NOTE — Progress Notes (Signed)
POD # 2  Subjective: Pt reports feeling well/ Pain controlled with Motrin and Percocet Tolerating po/Voiding without problems/ No n/v/ Flatus present No HA, visual disturbance, or epigastric pain Activity: ad lib Bleeding is light Newborn info:  Information for the patient's newborn:  Keelee, Yankey [[id number]]  female   Feeding: breast  Objective: VS:  Filed Vitals:   03/06/15 0824 03/06/15 1418 03/06/15 1744 03/07/15 0603  BP: 110/60 116/72 119/57 111/64  Pulse: 80 78 79 76  Temp: 98.5 F (36.9 C) 98.2 F (36.8 C) 98.8 F (37.1 C) 98.2 F (36.8 C)  TempSrc: Oral Oral Oral Oral  Resp: 16 18 18 16   Weight:      SpO2: 98% 98%      I&O: Intake/Output      09/21 0701 - 09/22 0700 09/22 0701 - 09/23 0700   P.O.     I.V. (mL/kg)     Total Intake(mL/kg)     Urine (mL/kg/hr) 475 (0.2)    Blood     Total Output 475     Net -475            LABS:  Recent Labs  03/06/15 0635 03/07/15 0540  WBC 9.6 7.7  HGB 8.7* 8.6*  HCT 26.4* 26.5*  PLT 149* 164    Blood type: --/--/O POS (09/20 1115) Rubella: Immune (03/08 0000)    Physical Exam:  General: alert, cooperative and no distress CV: Regular rate and rhythm Resp: CTA bilaterally Abdomen: soft, nontender, normal bowel sounds Uterine Fundus: firm, below umbilicus, nontender Incision: Covered with Tegaderm and honeycomb dressing; no significant drainage, edema, bruising, or erythema; well approximated with suture Lochia: minimal Ext: extremities normal, atraumatic, no cyanosis or edema and Homans sign is negative, no sign of DVT   Assessment/: POD # 2/ G2P2001/ S/P C/Section d/t repeat  Mild PEC, delivered IDA with compounding ABL anemia Doing well  Plan: Continue routine post op orders Anticipate discharge home tomorrow   Signed: Julianne Handler, Delane Ginger, MSN, CNM 03/07/2015, 2:12 PM

## 2015-03-08 ENCOUNTER — Ambulatory Visit: Payer: Self-pay

## 2015-03-08 MED ORDER — OXYCODONE-ACETAMINOPHEN 5-325 MG PO TABS: 1.0000 | ORAL_TABLET | ORAL | Status: DC | PRN

## 2015-03-08 MED ORDER — IBUPROFEN 600 MG PO TABS: 600.0000 mg | ORAL_TABLET | Freq: Four times a day (QID) | ORAL | Status: DC

## 2015-03-08 NOTE — Discharge Instructions (Signed)
Breast Pumping Tips °If you are breastfeeding, there may be times when you cannot feed your baby directly. Returning to work or going on a trip are common examples. Pumping allows you to store breast milk and feed it to your baby later.  °You may not get much milk when you first start to pump. Your breasts should start to make more after a few days. If you pump at the times you usually feed your baby, you may be able to keep making enough milk to feed your baby without also using formula. The more often you pump, the more milk you will produce.  °WHEN SHOULD I PUMP?  °· You can begin to pump soon after delivery. However, some experts recommend waiting about 4 weeks before giving your infant a bottle to make sure breastfeeding is going well.  °· If you plan to return to work, begin pumping a few weeks before. This will help you develop techniques that work best for you. It also lets you build up a supply of breast milk.   °· When you are with your infant, feed on demand and pump after each feeding.   °· When you are away from your infant for several hours, pump for about 15 minutes every 2-3 hours. Pump both breasts at the same time if you can.   °· If your infant has a formula feeding, make sure to pump around the same time.     °· If you drink any alcohol, wait 2 hours before pumping.   °HOW DO I PREPARE TO PUMP? °Your let-down reflex is the natural reaction to stimulation that makes your breast milk flow. It is easier to stimulate this reflex when you are relaxed. Find relaxation techniques that work for you. If you have difficulty with your let-down reflex, try these methods:  °· Smell one of your infant's blankets or an item of clothing.   °· Look at a picture or video of your infant.   °· Sit in a quiet, private space.   °· Massage the breast you plan to pump.   °· Place soothing warmth on the breast.   °· Play relaxing music.   °WHAT ARE SOME GENERAL BREAST PUMPING TIPS? °· Wash your hands before you pump. You  do not need to wash your nipples or breasts. °· There are three ways to pump. °¨ You can use your hand to massage and compress your breast. °¨ You can use a handheld manual pump. °¨ You can use an electric pump.   °· Make sure the suction cup (flange) on the breast pump is the right size. Place the flange directly over the nipple. If it is the wrong size or placed the wrong way, it may be painful and cause nipple damage.   °· If pumping is uncomfortable, apply a small amount of purified or modified lanolin to your nipple and areola. °· If you are using an electric pump, adjust the speed and suction power to be more comfortable. °· If pumping is painful or if you are not getting very much milk, you may need a different type of pump. A lactation consultant can help you determine what type of pump to use.   °· Keep a full water bottle near you at all times. Drinking lots of fluid helps you make more milk.  °· You can store your milk to use later. Pumped breast milk can be stored in a sealable, sterile container or plastic bag. Label all stored breast milk with the date you pumped it. °¨ Milk can stay out at room temperature for up to 8 hours. °¨   You can store your milk in the refrigerator for up to 8 days. °¨ You can store your milk in the freezer for 3 months. Thaw frozen milk using warm water. Do not put it in the microwave. °· Do not smoke. Smoking can lower your milk supply and harm your infant. If you need help quitting, ask your health care provider to recommend a program.   °WHEN SHOULD I CALL MY HEALTH CARE PROVIDER OR A LACTATION CONSULTANT? °· You are having trouble pumping. °· You are concerned that you are not making enough milk. °· You have nipple pain, soreness, or redness. °· You want to use birth control. Birth control pills may lower your milk supply. Talk to your health care provider about your options. °Document Released: 11/19/2009 Document Revised: 06/06/2013 Document Reviewed:  03/24/2013 °[employer]® Patient Information ©2015 [employer]. This information is not intended to replace advice given to you by your health care provider. Make sure you discuss any questions you have with your health care provider. ° °Nutrition for the New Mother  °A new mother needs good health and nutrition so she can have energy to take care of a new baby. Whether a mother breastfeeds or formula feeds the baby, it is important to have a well-balanced diet. Foods from all the food groups should be chosen to meet the new mother's energy needs and to give her the nutrients needed for repair and healing.  °A HEALTHY EATING PLAN °The My Pyramid plan for Moms outlines what you should eat to help you and your baby stay healthy. The energy and amount of food you need depends on whether or not you are breastfeeding. If you are breastfeeding you will need more nutrients. If you choose not to breastfeed, your nutrition goal should be to return to a healthy weight. Limiting calories may be needed if you are not breastfeeding.  °HOME CARE INSTRUCTIONS  °· For a personal plan based on your unique needs, see your Registered Dietitian or visit www.mypyramid.gov. °· Eat a variety of foods. The plan below will help guide you. The following chart has a suggested daily meal plan from the My Pyramid for Moms. °· Eat a variety of fruits and vegetables. °· Eat more dark green and orange vegetables and cooked dried beans. °· Make half your grains whole grains. Choose whole instead of refined grains. °· Choose low-fat or lean meats and poultry. °· Choose low-fat or fat-free dairy products like milk, cheese, or yogurt. °Fruits °· Breastfeeding: 2 cups °· Non-Breastfeeding: 2 cups °· What Counts as a serving? °¨ 1 cup of fruit or juice. °¨ ½ cup dried fruit. °Vegetables °· Breastfeeding: 3 cups °· Non-Breastfeeding: 2 ½ cups °· What Counts as a serving? °¨ 1 cup raw or cooked vegetables. °¨ Juice or 2 cups raw leafy  vegetables. °Grains °· Breastfeeding: 8 oz °· Non-Breastfeeding: 6 oz °· What Counts as a serving? °¨ 1 slice bread. °¨ 1 oz ready-to-eat cereal. °¨ ½ cup cooked pasta, rice, or cereal. °Meat and Beans °· Breastfeeding: 6 ½ oz °· Non-Breastfeeding: 5 ½ oz °· What Counts as a serving? °¨ 1 oz lean meat, poultry, or fish °¨ ¼ cup cooked dry beans °¨ ½ oz nuts or 1 egg °¨ 1 tbs peanut butter °Milk °· Breastfeeding: 3 cups °· Non-Breastfeeding: 3 cups °· What Counts as a serving? °¨ 1 cup milk. °¨ 8 oz yogurt. °¨ 1 ½ oz cheese. °¨ 2 oz processed cheese. °TIPS FOR THE BREASTFEEDING MOM °· Rapid weight   loss is not suggested when you are breastfeeding. By simply breastfeeding, you will be able to lose the weight gained during your pregnancy. Your caregiver can keep track of your weight and tell you if your weight loss is appropriate.  Be sure to drink fluids. You may notice that you are thirstier than usual. A suggestion is to drink a glass of water or other beverage whenever you breastfeed.  Avoid alcohol as it can be passed into your breast milk.  Limit caffeine drinks to no more than 2 to 3 cups per day.  You may need to keep taking your prenatal vitamin while you are breastfeeding. Talk with your caregiver about taking a vitamin or supplement. RETURING TO A HEALTHY WEIGHT  The My Pyramid Plan for Moms will help you return to a healthy weight. It will also provide the nutrients you need.  You may need to limit "empty" calories. These include:  High fat foods like fried foods, fatty meats, fast food, butter, and mayonnaise.  High sugar foods like sodas, jelly, candy, and sweets.  Be physically active. Include 30 minutes of exercise or more each day. Choose an activity you like such as walking, swimming, biking, or aerobics. Check with your caregiver before you start to exercise. Document Released: 09/08/2007 Document Revised: 08/24/2011 Document Reviewed: 09/08/2007 [facility] Patient Information  2015 [place], [place]. This information is not intended to replace advice given to you by your health care provider. Make sure you discuss any questions you have with your health care provider. Postpartum Care After Cesarean Delivery After you deliver your newborn (postpartum period), the usual stay in the hospital is 24-72 hours. If there were problems with your labor or delivery, or if you have other medical problems, you might be in the hospital longer.  While you are in the hospital, you will receive help and instructions on how to care for yourself and your newborn during the postpartum period.  While you are in the hospital:  It is normal for you to have pain or discomfort from the incision in your abdomen. Be sure to tell your nurses when you are having pain, where the pain is located, and what makes the pain worse.  If you are breastfeeding, you may feel uncomfortable contractions of your uterus for a couple of weeks. This is normal. The contractions help your uterus get back to normal size.  It is normal to have some bleeding after delivery.  For the first 1-3 days after delivery, the flow is red and the amount may be similar to a period.  It is common for the flow to start and stop.  In the first few days, you may pass some small clots. Let your nurses know if you begin to pass large clots or your flow increases.  Do not  flush blood clots down the toilet before having the nurse look at them.  During the next 3-10 days after delivery, your flow should become more watery and pink or brown-tinged in color.  Ten to fourteen days after delivery, your flow should be a small amount of yellowish-white discharge.  The amount of your flow will decrease over the first few weeks after delivery. Your flow may stop in 6-8 weeks. Most women have had their flow stop by 12 weeks after delivery.  You should change your sanitary pads frequently.  Wash your hands thoroughly with soap and water for  at least 20 seconds after changing pads, using the toilet, or before holding or feeding your  newborn.  Your intravenous (IV) tubing will be removed when you are drinking enough fluids.  The urine drainage tube (urinary catheter) that was inserted before delivery may be removed within 6-8 hours after delivery or when feeling returns to your legs. You should feel like you need to empty your bladder within the first 6-8 hours after the catheter has been removed.  In case you become weak, lightheaded, or faint, call your nurse before you get out of bed for the first time and before you take a shower for the first time.  Within the first few days after delivery, your breasts may begin to feel tender and full. This is called engorgement. Breast tenderness usually goes away within 48-72 hours after engorgement occurs. You may also notice milk leaking from your breasts. If you are not breastfeeding, do not stimulate your breasts. Breast stimulation can make your breasts produce more milk.  Spending as much time as possible with your newborn is very important. During this time, you and your newborn can feel close and get to know each other. Having your newborn stay in your room (rooming in) will help to strengthen the bond with your newborn. It will give you time to get to know your newborn and become comfortable caring for your newborn.  Your hormones change after delivery. Sometimes the hormone changes can temporarily cause you to feel sad or tearful. These feelings should not last more than a few days. If these feelings last longer than that, you should talk to your caregiver.  If desired, talk to your caregiver about methods of family planning or contraception.  Talk to your caregiver about immunizations. Your caregiver may want you to have the following immunizations before leaving the hospital:  Tetanus, diphtheria, and pertussis (Tdap) or tetanus and diphtheria (Td) immunization. It is very important  that you and your family (including grandparents) or others caring for your newborn are up-to-date with the Tdap or Td immunizations. The Tdap or Td immunization can help protect your newborn from getting ill.  Rubella immunization.  Varicella (chickenpox) immunization.  Influenza immunization. You should receive this annual immunization if you did not receive the immunization during your pregnancy. Document Released: 02/24/2012 Document Reviewed: 02/24/2012 [facility] Patient Information 2015 [place]. This information is not intended to replace advice given to you by your health care provider. Make sure you discuss any questions you have with your health care provider. Postpartum Depression and Baby Blues The postpartum period begins right after the birth of a baby. During this time, there is often a great amount of joy and excitement. It is also a time of many changes in the life of the parents. Regardless of how many times a mother gives birth, each child brings new challenges and dynamics to the family. It is not unusual to have feelings of excitement along with confusing shifts in moods, emotions, and thoughts. All mothers are at risk of developing postpartum depression or the "baby blues." These mood changes can occur right after giving birth, or they may occur many months after giving birth. The baby blues or postpartum depression can be mild or severe. Additionally, postpartum depression can go away rather quickly, or it can be a long-term condition.  CAUSES Raised hormone levels and the rapid drop in those levels are thought to be a main cause of postpartum depression and the baby blues. A number of hormones change during and after pregnancy. Estrogen and progesterone usually decrease right after the delivery of your baby. The  levels of thyroid hormone and various cortisol steroids also rapidly drop. Other factors that play a role in these mood changes include major life events and  genetics.  RISK FACTORS If you have any of the following risks for the baby blues or postpartum depression, know what symptoms to watch out for during the postpartum period. Risk factors that may increase the likelihood of getting the baby blues or postpartum depression include:  Having a personal or family history of depression.   Having depression while being pregnant.   Having premenstrual mood issues or mood issues related to oral contraceptives.  Having a lot of life stress.   Having marital conflict.   Lacking a social support network.   Having a baby with special needs.   Having health problems, such as diabetes.  SIGNS AND SYMPTOMS Symptoms of baby blues include:  Brief changes in mood, such as going from extreme happiness to sadness.  Decreased concentration.   Difficulty sleeping.   Crying spells, tearfulness.   Irritability.   Anxiety.  Symptoms of postpartum depression typically begin within the first month after giving birth. These symptoms include:  Difficulty sleeping or excessive sleepiness.   Marked weight loss.   Agitation.   Feelings of worthlessness.   Lack of interest in activity or food.  Postpartum psychosis is a very serious condition and can be dangerous. Fortunately, it is rare. Displaying any of the following symptoms is cause for immediate medical attention. Symptoms of postpartum psychosis include:   Hallucinations and delusions.   Bizarre or disorganized behavior.   Confusion or disorientation.  DIAGNOSIS  A diagnosis is made by an evaluation of your symptoms. There are no medical or lab tests that lead to a diagnosis, but there are various questionnaires that a health care provider may use to identify those with the baby blues, postpartum depression, or psychosis. Often, a screening tool called the [place] Postnatal Depression Scale is used to diagnose depression in the postpartum period.  TREATMENT The baby  blues usually goes away on its own in 1-2 weeks. Social support is often all that is needed. You will be encouraged to get adequate sleep and rest. Occasionally, you may be given medicines to help you sleep.  Postpartum depression requires treatment because it can last several months or longer if it is not treated. Treatment may include individual or group therapy, medicine, or both to address any social, physiological, and psychological factors that may play a role in the depression. Regular exercise, a healthy diet, rest, and social support may also be strongly recommended.  Postpartum psychosis is more serious and needs treatment right away. Hospitalization is often needed. HOME CARE INSTRUCTIONS  Get as much rest as you can. Nap when the baby sleeps.   Exercise regularly. Some women find yoga and walking to be beneficial.   Eat a balanced and nourishing diet.   Do little things that you enjoy. Have a cup of tea, take a bubble bath, read your favorite magazine, or listen to your favorite music.  Avoid alcohol.   Ask for help with household chores, cooking, grocery shopping, or running errands as needed. Do not try to do everything.   Talk to people close to you about how you are feeling. Get support from your partner, family members, friends, or other new moms.  Try to stay positive in how you think. Think about the things you are grateful for.   Do not spend a lot of time alone.   Only take over-the-counter  or prescription medicine as directed by your health care provider.  Keep all your postpartum appointments.   Let your health care provider know if you have any concerns.  SEEK MEDICAL CARE IF: You are having a reaction to or problems with your medicine. SEEK IMMEDIATE MEDICAL CARE IF:  You have suicidal feelings.   You think you may harm the baby or someone else. MAKE SURE YOU:  Understand these instructions.  Will watch your condition.  Will get help right  away if you are not doing well or get worse. Document Released: 03/05/2004 Document Revised: 06/06/2013 Document Reviewed: 03/13/2013 [facility] Patient Information 2015 [place], [place]. This information is not intended to replace advice given to you by your health care provider. Make sure you discuss any questions you have with your health care provider. Breastfeeding and Mastitis Mastitis is inflammation of the breast tissue. It can occur in women who are breastfeeding. This can make breastfeeding painful. Mastitis will sometimes go away on its own. Your health care provider will help determine if treatment is needed. CAUSES Mastitis is often associated with a blocked milk (lactiferous) duct. This can happen when too much milk builds up in the breast. Causes of excess milk in the breast can include:  Poor latch-on. If your baby is not latched onto the breast properly, she or he may not empty your breast completely while breastfeeding.  Allowing too much time to pass between feedings.  Wearing a bra or other clothing that is too tight. This puts extra pressure on the lactiferous ducts so milk does not flow through them as it should. Mastitis can also be caused by a bacterial infection. Bacteria may enter the breast tissue through cuts or openings in the skin. In women who are breastfeeding, this may occur because of cracked or irritated skin. Cracks in the skin are often caused when your baby does not latch on properly to the breast. SIGNS AND SYMPTOMS  Swelling, redness, tenderness, and pain in an area of the breast.  Swelling of the glands under the arm on the same side.  Fever may or may not accompany mastitis. If an infection is allowed to progress, a collection of pus (abscess) may develop. DIAGNOSIS  Your health care provider can usually diagnose mastitis based on your symptoms and a physical exam. Tests may be done to help confirm the diagnosis. These may include:  Removal of pus from  the breast by applying pressure to the area. This pus can be examined in the lab to determine which bacteria are present. If an abscess has developed, the fluid in the abscess can be removed with a needle. This can also be used to confirm the diagnosis and determine the bacteria present. In most cases, pus will not be present.  Blood tests to determine if your body is fighting a bacterial infection.  Mammogram or ultrasound tests to rule out other problems or diseases. TREATMENT  Mastitis that occurs with breastfeeding will sometimes go away on its own. Your health care provider may choose to wait 24 hours after first seeing you to decide whether a prescription medicine is needed. If your symptoms are worse after 24 hours, your health care provider will likely prescribe an antibiotic medicine to treat the mastitis. He or she will determine which bacteria are most likely causing the infection and will then select an appropriate antibiotic medicine. This is sometimes changed based on the results of tests performed to identify the bacteria, or if there is no response to  the antibiotic medicine selected. Antibiotic medicines are usually given by mouth. You may also be given medicine for pain. HOME CARE INSTRUCTIONS  Only take over-the-counter or prescription medicines for pain, fever, or discomfort as directed by your health care provider.  If your health care provider prescribed an antibiotic medicine, take the medicine as directed. Make sure you finish it even if you start to feel better.  Do not wear a tight or underwire bra. Wear a soft, supportive bra.  Increase your fluid intake, especially if you have a fever.  Continue to empty the breast. Your health care provider can tell you whether this milk is safe for your infant or needs to be thrown out. You may be told to stop nursing until your health care provider thinks it is safe for your baby. Use a breast pump if you are advised to stop  nursing.  Keep your nipples clean and dry.  Empty the first breast completely before going to the other breast. If your baby is not emptying your breasts completely for some reason, use a breast pump to empty your breasts.  If you go back to work, pump your breasts while at work to stay in time with your nursing schedule.  Avoid allowing your breasts to become overly filled with milk (engorged). SEEK MEDICAL CARE IF:  You have pus-like discharge from the breast.  Your symptoms do not improve with the treatment prescribed by your health care provider within 2 days. SEEK IMMEDIATE MEDICAL CARE IF:  Your pain and swelling are getting worse.  You have pain that is not controlled with medicine.  You have a red line extending from the breast toward your armpit.  You have a fever or persistent symptoms for more than 2-3 days.  You have a fever and your symptoms suddenly get worse. MAKE SURE YOU:   Understand these instructions.  Will watch your condition.  Will get help right away if you are not doing well or get worse. Document Released: 09/26/2004 Document Revised: 06/06/2013 Document Reviewed: 01/05/2013 [facility] Patient Information 2015 [place], [place]. This information is not intended to replace advice given to you by your health care provider. Make sure you discuss any questions you have with your health care provider. Breastfeeding Deciding to breastfeed is one of the best choices you can make for you and your baby. A change in hormones during pregnancy causes your breast tissue to grow and increases the number and size of your milk ducts. These hormones also allow proteins, sugars, and fats from your blood supply to make breast milk in your milk-producing glands. Hormones prevent breast milk from being released before your baby is born as well as prompt milk flow after birth. Once breastfeeding has begun, thoughts of your baby, as well as his or her sucking or crying, can  stimulate the release of milk from your milk-producing glands.  BENEFITS OF BREASTFEEDING For Your Baby  Your first milk (colostrum) helps your baby's digestive system function better.   There are antibodies in your milk that help your baby fight off infections.   Your baby has a lower incidence of asthma, allergies, and sudden infant death syndrome.   The nutrients in breast milk are better for your baby than infant formulas and are designed uniquely for your baby's needs.   Breast milk improves your baby's brain development.   Your baby is less likely to develop other conditions, such as childhood obesity, asthma, or type 2 diabetes mellitus.  For You  Breastfeeding helps to create a very special bond between you and your baby.   Breastfeeding is convenient. Breast milk is always available at the correct temperature and costs nothing.   Breastfeeding helps to burn calories and helps you lose the weight gained during pregnancy.   Breastfeeding makes your uterus contract to its prepregnancy size faster and slows bleeding (lochia) after you give birth.   Breastfeeding helps to lower your risk of developing type 2 diabetes mellitus, osteoporosis, and breast or ovarian cancer later in life. SIGNS THAT YOUR BABY IS HUNGRY Early Signs of Hunger  Increased alertness or activity.  Stretching.  Movement of the head from side to side.  Movement of the head and opening of the mouth when the corner of the mouth or cheek is stroked (rooting).  Increased sucking sounds, smacking lips, cooing, sighing, or squeaking.  Hand-to-mouth movements.  Increased sucking of fingers or hands. Late Signs of Hunger  Fussing.  Intermittent crying. Extreme Signs of Hunger Signs of extreme hunger will require calming and consoling before your baby will be able to breastfeed successfully. Do not wait for the following signs of extreme hunger to occur before you initiate breastfeeding:    Restlessness.  A loud, strong cry.   Screaming. BREASTFEEDING BASICS Breastfeeding Initiation  Find a comfortable place to sit or lie down, with your neck and back well supported.  Place a pillow or rolled up blanket under your baby to bring him or her to the level of your breast (if you are seated). Nursing pillows are specially designed to help support your arms and your baby while you breastfeed.  Make sure that your baby's abdomen is facing your abdomen.   Gently massage your breast. With your fingertips, massage from your chest wall toward your nipple in a circular motion. This encourages milk flow. You may need to continue this action during the feeding if your milk flows slowly.  Support your breast with 4 fingers underneath and your thumb above your nipple. Make sure your fingers are well away from your nipple and your baby's mouth.   Stroke your baby's lips gently with your finger or nipple.   When your baby's mouth is open wide enough, quickly bring your baby to your breast, placing your entire nipple and as much of the colored area around your nipple (areola) as possible into your baby's mouth.   More areola should be visible above your baby's upper lip than below the lower lip.   Your baby's tongue should be between his or her lower gum and your breast.   Ensure that your baby's mouth is correctly positioned around your nipple (latched). Your baby's lips should create a seal on your breast and be turned out (everted).  It is common for your baby to suck about 2-3 minutes in order to start the flow of breast milk. Latching Teaching your baby how to latch on to your breast properly is very important. An improper latch can cause nipple pain and decreased milk supply for you and poor weight gain in your baby. Also, if your baby is not latched onto your nipple properly, he or she may swallow some air during feeding. This can make your baby fussy. Burping your baby when  you switch breasts during the feeding can help to get rid of the air. However, teaching your baby to latch on properly is still the best way to prevent fussiness from swallowing air while breastfeeding. Signs that your baby has successfully latched on to  your nipple:    Silent tugging or silent sucking, without causing you pain.   Swallowing heard between every 3-4 sucks.    Muscle movement above and in front of his or her ears while sucking.  Signs that your baby has not successfully latched on to nipple:   Sucking sounds or smacking sounds from your baby while breastfeeding.  Nipple pain. If you think your baby has not latched on correctly, slip your finger into the corner of your baby's mouth to break the suction and place it between your baby's gums. Attempt breastfeeding initiation again. Signs of Successful Breastfeeding Signs from your baby:   A gradual decrease in the number of sucks or complete cessation of sucking.   Falling asleep.   Relaxation of his or her body.   Retention of a small amount of milk in his or her mouth.   Letting go of your breast by himself or herself. Signs from you:  Breasts that have increased in firmness, weight, and size 1-3 hours after feeding.   Breasts that are softer immediately after breastfeeding.  Increased milk volume, as well as a change in milk consistency and color by the fifth day of breastfeeding.   Nipples that are not sore, cracked, or bleeding. Signs That Your [employer] is Getting Enough Milk  Wetting at least 3 diapers in a 24-hour period. The urine should be clear and pale yellow by age 2 days.  At least 3 stools in a 24-hour period by age 2 days. The stool should be soft and yellow.  At least 3 stools in a 24-hour period by age 50 days. The stool should be seedy and yellow.  No loss of weight greater than 10% of birth weight during the first 3 days of age.  Average weight gain of 4-7 ounces (113-198 g) per week  after age 39 days.  Consistent daily weight gain by age 84 days, without weight loss after the age of 2 weeks. After a feeding, your baby may spit up a small amount. This is common. BREASTFEEDING FREQUENCY AND DURATION Frequent feeding will help you make more milk and can prevent sore nipples and breast engorgement. Breastfeed when you feel the need to reduce the fullness of your breasts or when your baby shows signs of hunger. This is called "breastfeeding on demand." Avoid introducing a pacifier to your baby while you are working to establish breastfeeding (the first 4-6 weeks after your baby is born). After this time you may choose to use a pacifier. Research has shown that pacifier use during the first year of a baby's life decreases the risk of sudden infant death syndrome (SIDS). Allow your baby to feed on each breast as long as he or she wants. Breastfeed until your baby is finished feeding. When your baby unlatches or falls asleep while feeding from the first breast, offer the second breast. Because newborns are often sleepy in the first few weeks of life, you may need to awaken your baby to get him or her to feed. Breastfeeding times will vary from baby to baby. However, the following rules can serve as a guide to help you ensure that your baby is properly fed:  Newborns (babies 37 weeks of age or younger) may breastfeed every 1-3 hours.  Newborns should not go longer than 3 hours during the day or 5 hours during the night without breastfeeding.  You should breastfeed your baby a minimum of 8 times in a 24-hour period until you begin to  introduce solid foods to your baby at around 67 months of age. BREAST MILK PUMPING Pumping and storing breast milk allows you to ensure that your baby is exclusively fed your breast milk, even at times when you are unable to breastfeed. This is especially important if you are going back to work while you are still breastfeeding or when you are not able to be  present during feedings. Your lactation consultant can give you guidelines on how long it is safe to store breast milk.  A breast pump is a machine that allows you to pump milk from your breast into a sterile bottle. The pumped breast milk can then be stored in a refrigerator or freezer. Some breast pumps are operated by hand, while others use electricity. Ask your lactation consultant which type will work best for you. Breast pumps can be purchased, but some hospitals and breastfeeding support groups lease breast pumps on a monthly basis. A lactation consultant can teach you how to hand express breast milk, if you prefer not to use a pump.  CARING FOR YOUR BREASTS WHILE YOU BREASTFEED Nipples can become dry, cracked, and sore while breastfeeding. The following recommendations can help keep your breasts moisturized and healthy:  Avoid using soap on your nipples.   Wear a supportive bra. Although not required, special nursing bras and tank tops are designed to allow access to your breasts for breastfeeding without taking off your entire bra or top. Avoid wearing underwire-style bras or extremely tight bras.  Air dry your nipples for 3-97minutes after each feeding.   Use only cotton bra pads to absorb leaked breast milk. Leaking of breast milk between feedings is normal.   Use lanolin on your nipples after breastfeeding. Lanolin helps to maintain your skin's normal moisture barrier. If you use pure lanolin, you do not need to wash it off before feeding your baby again. Pure lanolin is not toxic to your baby. You may also hand express a few drops of breast milk and gently massage that milk into your nipples and allow the milk to air dry. In the first few weeks after giving birth, some women experience extremely full breasts (engorgement). Engorgement can make your breasts feel heavy, warm, and tender to the touch. Engorgement peaks within 3-5 days after you give birth. The following recommendations can  help ease engorgement:  Completely empty your breasts while breastfeeding or pumping. You may want to start by applying warm, moist heat (in the shower or with warm water-soaked hand towels) just before feeding or pumping. This increases circulation and helps the milk flow. If your baby does not completely empty your breasts while breastfeeding, pump any extra milk after he or she is finished.  Wear a snug bra (nursing or regular) or tank top for 1-2 days to signal your body to slightly decrease milk production.  Apply ice packs to your breasts, unless this is too uncomfortable for you.  Make sure that your baby is latched on and positioned properly while breastfeeding. If engorgement persists after 48 hours of following these recommendations, contact your health care provider or a Science writer. OVERALL HEALTH CARE RECOMMENDATIONS WHILE BREASTFEEDING  Eat healthy foods. Alternate between meals and snacks, eating 3 of each per day. Because what you eat affects your breast milk, some of the foods may make your baby more irritable than usual. Avoid eating these foods if you are sure that they are negatively affecting your baby.  Drink milk, fruit juice, and water to satisfy your  thirst (about 10 glasses a day).   Rest often, relax, and continue to take your prenatal vitamins to prevent fatigue, stress, and anemia.  Continue breast self-awareness checks.  Avoid chewing and smoking tobacco.  Avoid alcohol and drug use. Some medicines that may be harmful to your baby can pass through breast milk. It is important to ask your health care provider before taking any medicine, including all over-the-counter and prescription medicine as well as vitamin and herbal supplements. It is possible to become pregnant while breastfeeding. If birth control is desired, ask your health care provider about options that will be safe for your baby. SEEK MEDICAL CARE IF:   You feel like you want to stop  breastfeeding or have become frustrated with breastfeeding.  You have painful breasts or nipples.  Your nipples are cracked or bleeding.  Your breasts are red, tender, or warm.  You have a swollen area on either breast.  You have a fever or chills.  You have nausea or vomiting.  You have drainage other than breast milk from your nipples.  Your breasts do not become full before feedings by the fifth day after you give birth.  You feel sad and depressed.  Your baby is too sleepy to eat well.  Your baby is having trouble sleeping.   Your baby is wetting less than 3 diapers in a 24-hour period.  Your baby has less than 3 stools in a 24-hour period.  Your baby's skin or the white part of his or her eyes becomes yellow.   Your baby is not gaining weight by 63 days of age. SEEK IMMEDIATE MEDICAL CARE IF:   Your baby is overly tired (lethargic) and does not want to wake up and feed.  Your baby develops an unexplained fever. Document Released: 06/01/2005 Document Revised: 06/06/2013 Document Reviewed: 11/23/2012 [facility] Patient Information 2015 [place], [place]. This information is not intended to replace advice given to you by your health care provider. Make sure you discuss any questions you have with your health care provider.

## 2015-03-08 NOTE — Progress Notes (Signed)
Patient ID: Candice Robertson, female   DOB: 12/15/1985, 29 y.o.   MRN: [id number] Subjective: S/P Repeat Cesarean Delivery  POD# 3 Information for the patient's newborn:  [name], [name] [[id number]]  female   Reports feeling well & ready for d/c home today Feeding: breast Patient reports tolerating PO.  Breast symptoms: none Pain controlled with ibuprofen (OTC) and narcotic analgesics including Percocet Denies HA/SOB/C/P/N/V/dizziness. Flatus present. No BM. She reports vaginal bleeding as normal, without clots.  She is ambulating, urinating without difficult.     Objective:   VS:  Filed Vitals:   03/06/15 1744 03/07/15 0603 03/07/15 1821 03/08/15 0648  BP: 119/57 111/64 124/73 111/72  Pulse: 79 76 100 94  Temp: 98.8 F (37.1 C) 98.2 F (36.8 C) 97.9 F (36.6 C) 97.9 F (36.6 C)  TempSrc: Oral Oral Oral Oral  Resp: 18 16 18 18   Weight:      SpO2:    99%    No intake or output data in the 24 hours ending 03/08/15 1049      Recent Labs  03/06/15 0635 03/07/15 0540  WBC 9.6 7.7  HGB 8.7* 8.6*  HCT 26.4* 26.5*  PLT 149* 164     Blood type: --/--/O POS (09/20 1115)  Rubella: Immune (03/08 0000)     Physical Exam:  General: alert, cooperative and no distress Abdomen: soft, nontender, normal bowel sounds Incision: clean, dry, intact and skin well-approximated with sutures Uterine Fundus: firm, 2 FB below umbilicus, nontender Lochia: minimal Ext: edema trace and Homans sign is negative, no sign of DVT   Assessment/Plan: 29 y.o.   POD# 3.  s/p Cesarean Delivery.  Indications: Repeat                Principal Problem:   Postpartum care following cesarean delivery (9/20) Active Problems:   Cesarean delivery delivered   Mild preeclampsia   Iron deficiency anemia of pregnancy   Acute blood loss anemia  Doing well, stable.               Regular diet as tolerated Ambulate Routine post-op care D/C home today  [name], MSN, CNM 03/08/2015, 10:49  AM

## 2015-03-08 NOTE — Lactation Note (Signed)
This note was copied from the chart of Candice Loyola Santino. Lactation Consultation Note; observed mother latching infant onto breast when I arrived in the room. Assist mother with proper support. Observed frequent audible swallows. Advised mother to feed infant 8-12  Times in 24 hours. Discussed infants need to cluster feed. Mother has a slight pink nipple on the left. She was given comfort gels. She has an electric pump at home. Mothers milk is coming in .  Her breast are filling and slightly firm. Mother receptive to all teaching. Advised to do good breast massage and to ice every 3-4 hour for the next 24 hours. Mother is aware of available [place] services.   Patient Name: Candice Robertson INOMV'E Date: 03/08/2015 Reason for consult: Follow-up assessment   Maternal Data    Feeding Feeding Type: Breast Fed Length of feed: 20 min  LATCH Score/Interventions Latch: Grasps breast easily, tongue down, lips flanged, rhythmical sucking.  Audible Swallowing: Spontaneous and intermittent  Type of Nipple: Everted at rest and after stimulation  Comfort (Breast/Nipple): Filling, red/small blisters or bruises, mild/mod discomfort  Problem noted: Filling  Hold (Positioning): Assistance needed to correctly position infant at breast and maintain latch. Intervention(s): Support Pillows;Position options  LATCH Score: 8  Lactation Tools Discussed/Used     Consult Status Consult Status: Complete    Darla Lesches 03/08/2015, 12:30 PM

## 2015-03-08 NOTE — Discharge Summary (Signed)
POSTOPERATIVE DISCHARGE SUMMARY:  Patient ID: Candice Robertson MRN: [id number] DOB/AGE: Mar 06, 1986 29 y.o.  Admit date: 03/05/2015 Admission Diagnoses: Scheduled Cesarean Delivery   Discharge date:  03/08/2015 Discharge Diagnoses: S/P Repeat C/S on 03/05/2015        Prenatal history: G2P2001   EDC : 03/26/2015, LMP  Has received prenatal care at [facility] since 9.[redacted] wks gestation. Primary provider : Dr. [name] Prenatal course complicated by previous cesarean delivery  Prenatal Labs: ABO, Rh: --/--/O POS (09/20 1115) Antibody: NEG (09/20 1115) Rubella: Immune (03/08 0000)  RPR: Non Reactive (09/20 1115)  HBsAg: Negative (03/08 0000)  HIV: Non-reactive (03/08 0000)  GTT : Normal -119 GBS: Negative  Medical / Surgical History :  Past medical history:  Past Medical History  Diagnosis Date  . Breech presentation   . GERD (gastroesophageal reflux disease)   . PP care - s/p 1C/S - breech 5/1 10/14/2011  . Dizziness   . Tachycardia     with 2016 pregancy only  . Anxiety disorder   . History of chicken pox   . Hypertension     With pregnancy only  . Anxiety     Past surgical history:  Past Surgical History  Procedure Laterality Date  . Tonsillectomy    . Cesarean section  10/14/2011    Procedure: CESAREAN SECTION;  Surgeon: Lovenia Kim, MD;  Location: [facility];  Service: Gynecology;  Laterality: N/A;  Primary EDD: 10/21/11  . Tonsilectomy/adenoidectomy with myringotomy  1991  . Tympanostomy tube placement  1989  . Cesarean section N/A 03/05/2015    Procedure: Repeat CESAREAN SECTION;  Surgeon: Brien Few, MD;  Location: [facility];  Service: Obstetrics;  Laterality: N/A;  EDD: 03/26/15      Allergies: Codeine phosphate   Intrapartum Course:  Admitted for scheduled cesarean delivery d/t previous cesarean delivery - see operative report for further details   Physical Exam:   VSS: Blood pressure 111/72, pulse 94, temperature 97.9 F (36.6 C),  temperature source Oral, resp. rate 18, weight 82.101 kg (181 lb), last menstrual period 06/19/2014, SpO2 99 %, currently breastfeeding.  LABS:  Recent Labs  03/06/15 0635 03/07/15 0540  WBC 9.6 7.7  HGB 8.7* 8.6*  PLT 149* 164    Newborn Data Live born female on 03/05/2015 Birth Weight: 6 lb 5.6 oz (2880 g) APGAR: 8, 9  See operative report for further details  Home with mother.  Discharge Instructions:  Wound Care: keep clean and dry / remove honeycomb POD 5 Postpartum Instructions: [facility] discharge booklet - instructions reviewed Medications:    Medication List    TAKE these medications        acetaminophen 500 MG tablet  Commonly known as:  TYLENOL  Take 500 mg by mouth every 6 (six) hours as needed for headache (pain).     busPIRone 5 MG tablet  Commonly known as:  BUSPAR  Take 0.25 mg by mouth 2 (two) times daily as needed (anxiety).     ibuprofen 600 MG tablet  Commonly known as:  ADVIL,MOTRIN  Take 1 tablet (600 mg total) by mouth every 6 (six) hours.     metoprolol succinate 25 MG 24 hr tablet  Commonly known as:  TOPROL-XL  Take 50 mg by mouth daily.     oxyCODONE-acetaminophen 5-325 MG per tablet  Commonly known as:  PERCOCET/ROXICET  Take 1 tablet by mouth every 4 (four) hours as needed (for pain scale 4-7).     prenatal multivitamin Tabs  tablet  Take 1 tablet by mouth daily at 12 noon.     ranitidine 75 MG tablet  Commonly known as:  ZANTAC  Take 75 mg by mouth at bedtime.           Follow-up Information    Follow up with Lovenia Kim, MD. Schedule an appointment as soon as possible for a visit in 6 weeks.   Specialty:  Obstetrics and Gynecology   Why:  postpartum visit   Contact information:   [place] [address] [phone]        Signed: [name], MSN, CNM 03/08/2015, 11:04 AM

## 2015-04-30 ENCOUNTER — Ambulatory Visit (INDEPENDENT_AMBULATORY_CARE_PROVIDER_SITE_OTHER): Payer: 59 | Admitting: Family Medicine

## 2015-04-30 ENCOUNTER — Other Ambulatory Visit: Payer: Self-pay | Admitting: Family Medicine

## 2015-04-30 ENCOUNTER — Encounter: Payer: Self-pay | Admitting: Family Medicine

## 2015-04-30 VITALS — BP 138/84 | HR 96 | Wt 172.0 lb

## 2015-04-30 DIAGNOSIS — IMO0001 Reserved for inherently not codable concepts without codable children: Secondary | ICD-10-CM

## 2015-04-30 DIAGNOSIS — R51 Headache: Secondary | ICD-10-CM

## 2015-04-30 DIAGNOSIS — F411 Generalized anxiety disorder: Secondary | ICD-10-CM | POA: Diagnosis not present

## 2015-04-30 DIAGNOSIS — R42 Dizziness and giddiness: Secondary | ICD-10-CM

## 2015-04-30 DIAGNOSIS — R000 Tachycardia, unspecified: Secondary | ICD-10-CM | POA: Diagnosis not present

## 2015-04-30 DIAGNOSIS — R519 Headache, unspecified: Secondary | ICD-10-CM

## 2015-04-30 DIAGNOSIS — R030 Elevated blood-pressure reading, without diagnosis of hypertension: Secondary | ICD-10-CM

## 2015-04-30 LAB — CBC WITH DIFFERENTIAL/PLATELET
BASOS ABS: 0 10*3/uL (ref 0.0–0.1)
Basophils Relative: 0 % (ref 0–1)
EOS PCT: 3 % (ref 0–5)
Eosinophils Absolute: 0.2 10*3/uL (ref 0.0–0.7)
HEMATOCRIT: 39 % (ref 36.0–46.0)
HEMOGLOBIN: 12.7 g/dL (ref 12.0–15.0)
LYMPHS ABS: 2.2 10*3/uL (ref 0.7–4.0)
LYMPHS PCT: 30 % (ref 12–46)
MCH: 27 pg (ref 26.0–34.0)
MCHC: 32.6 g/dL (ref 30.0–36.0)
MCV: 82.8 fL (ref 78.0–100.0)
MPV: 10.5 fL (ref 8.6–12.4)
Monocytes Absolute: 0.5 10*3/uL (ref 0.1–1.0)
Monocytes Relative: 7 % (ref 3–12)
NEUTROS ABS: 4.4 10*3/uL (ref 1.7–7.7)
NEUTROS PCT: 60 % (ref 43–77)
Platelets: 274 10*3/uL (ref 150–400)
RBC: 4.71 MIL/uL (ref 3.87–5.11)
RDW: 14.6 % (ref 11.5–15.5)
WBC: 7.4 10*3/uL (ref 4.0–10.5)

## 2015-04-30 MED ORDER — SERTRALINE HCL 50 MG PO TABS: ORAL_TABLET | ORAL | Status: DC

## 2015-04-30 NOTE — Progress Notes (Signed)
   Subjective:    Patient ID: Candice Robertson, female    DOB: Oct 31, 1985, 29 y.o.   MRN: [id number]  HPI  Had her baby as 36.5 weeks.  She is 8 weeks post partum.  She anxiety is still an issue . She feel like she is having some physical symptoms as well.  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  Says she feels likght headed at times, dizzy, headache.  She off the metoprolol 25mg .  She is sleeping throuh the night. Only getting up with the baby maybe twice at night. She is nursing.  + hotflashes. No fam hx of thyroid problems. She will actually be going back to work in about 3 weeks and is very worried about how she will perform at her job with her level of anxiety. She says some morning she wakes up and just feels like her skin is tight and crawling. She's been using the BuSpar as needed. She was switched to that during her pregnancy.  Review of Systems     Objective:   Physical Exam  Constitutional: She is oriented to person, place, and time. She appears well-developed and well-nourished.  HENT:  Head: Normocephalic and atraumatic.  Cardiovascular: Normal rate, regular rhythm and normal heart sounds.   Pulmonary/Chest: Effort normal and breath sounds normal.  Neurological: She is alert and oriented to person, place, and time.  Skin: Skin is warm and dry.  Psychiatric: She has a normal mood and affect. Her behavior is normal.          Assessment & Plan:  Headaches - work on sleep and reducing anxiety and stress. Will check for anemia and TSH.  She did have anemia during her pregnancy.    Dizziness/lightheadedness - again will check a CBC and check for anemia as well as check her thyroid.  Anxiety -uncontrolled. Discussed options including starting an SSRI. will start sertraline. Discussed potential side effects. F/U in 3-4 weeks. GAD- 7 score of 10, previous of 17.  She rates her symptoms as very difficult. Follow-up in 3 weeks.  Elevated BP - repeat BP  was better. Will Monitor.

## 2015-05-01 LAB — COMPLETE METABOLIC PANEL WITH GFR
ALT: 84 U/L — AB (ref 6–29)
AST: 43 U/L — ABNORMAL HIGH (ref 10–30)
Albumin: 4.7 g/dL (ref 3.6–5.1)
Alkaline Phosphatase: 85 U/L (ref 33–115)
BUN: 14 mg/dL (ref 7–25)
CHLORIDE: 102 mmol/L (ref 98–110)
CO2: 30 mmol/L (ref 20–31)
CREATININE: 0.71 mg/dL (ref 0.50–1.10)
Calcium: 9.8 mg/dL (ref 8.6–10.2)
GFR, Est Non African American: >89 mL/min (ref >=60)
Glucose, Bld: 86 mg/dL (ref 65–99)
POTASSIUM: 4.6 mmol/L (ref 3.5–5.3)
Sodium: 140 mmol/L (ref 135–146)
Total Bilirubin: 0.3 mg/dL (ref 0.2–1.2)
Total Protein: 7.3 g/dL (ref 6.1–8.1)

## 2015-05-01 LAB — TSH: TSH: 1.424 u[IU]/mL (ref 0.350–4.500)

## 2015-05-01 LAB — FERRITIN: Ferritin: 43 ng/mL (ref 10–291)

## 2015-05-02 LAB — HEPATITIS PANEL, ACUTE
HCV AB: NEGATIVE
HEP A IGM: NONREACTIVE
HEP B S AG: NEGATIVE
Hep B C IgM: NONREACTIVE

## 2015-05-06 ENCOUNTER — Telehealth: Payer: Self-pay

## 2015-05-06 MED ORDER — BUSPIRONE HCL 5 MG PO TABS: 2.5000 mg | ORAL_TABLET | Freq: Two times a day (BID) | ORAL | Status: DC | PRN

## 2015-05-06 NOTE — Telephone Encounter (Signed)
Pt states she had "delusions" while taking zoloft so she stopped taking would like to know if she can buspar on a regular bases instead. Pt states buspar was rx'd to her by her OB/GYN.

## 2015-05-06 NOTE — Telephone Encounter (Signed)
She can take the Zoloft regularly but studies show that it's not nearly as effective as an SSRI. If she wants to consider something besides the Zoloft we can try something else. The lesions are exceedingly rare on that medication.

## 2015-05-06 NOTE — Telephone Encounter (Signed)
Pt states she prefers to stick with buspar. Says she couldn't drive, felt like her skin was crawling and was hearing things that wasn't there.

## 2015-05-14 ENCOUNTER — Telehealth: Payer: Self-pay

## 2015-05-14 DIAGNOSIS — O99345 Other mental disorders complicating the puerperium: Principal | ICD-10-CM

## 2015-05-14 DIAGNOSIS — F53 Postpartum depression: Secondary | ICD-10-CM

## 2015-05-14 NOTE — Telephone Encounter (Signed)
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

## 2015-05-21 ENCOUNTER — Encounter: Payer: Self-pay | Admitting: Family Medicine

## 2015-05-21 ENCOUNTER — Ambulatory Visit (INDEPENDENT_AMBULATORY_CARE_PROVIDER_SITE_OTHER): Payer: 59 | Admitting: Family Medicine

## 2015-05-21 VITALS — BP 121/74 | HR 84 | Ht 60.0 in | Wt 171.5 lb

## 2015-05-21 DIAGNOSIS — F411 Generalized anxiety disorder: Secondary | ICD-10-CM | POA: Diagnosis not present

## 2015-05-21 MED ORDER — ESCITALOPRAM OXALATE 10 MG PO TABS: 10.0000 mg | ORAL_TABLET | Freq: Every day | ORAL | Status: DC

## 2015-05-21 NOTE — Progress Notes (Signed)
   Subjective:    Patient ID: Candice Robertson, female    DOB: 1985/11/04, 29 y.o.   MRN: [id number]  HPI Anxiety - doing well. Says the lexapro is working well.  No side effects.  She is doing much better. Only worries about things for about 15 min at a time.  She does still take one Buspar once in the Am, and she still feels a little bit overwhelmed when she first gets up and she is trying to get the kids to daycare and get herself to work.. She started back to work this week. She did get a phone call from a local therapist and says she plans to call back and get started on some therapy.   Review of Systems     Objective:   Physical Exam  Constitutional: She is oriented to person, place, and time. She appears well-developed and well-nourished.  HENT:  Head: Normocephalic and atraumatic.  Cardiovascular: Normal rate, regular rhythm and normal heart sounds.   Pulmonary/Chest: Effort normal and breath sounds normal.  Neurological: She is alert and oriented to person, place, and time.  Skin: Skin is warm and dry.  Psychiatric: She has a normal mood and affect. Her behavior is normal.          Assessment & Plan:  GAD- GAD-7 score of 3. Well controlled. Continue current regimen with Lexapro. Previous GAD - 7 score of 10 previously. She plans on starting therapy as well.  Continue work on Administrator, sports over the next 2-3 weeks. Follow-up in 3 months if she's doing well.  Will call to get last pap smear.

## 2015-07-16 DIAGNOSIS — F410 Panic disorder [episodic paroxysmal anxiety] without agoraphobia: Secondary | ICD-10-CM | POA: Diagnosis not present

## 2015-08-01 MED FILL — NORETHINDRONE 0.35 MG TAB: 0.35 | 84 days supply | Qty: 84 | Fill #0

## 2015-08-19 ENCOUNTER — Encounter: Payer: Self-pay | Admitting: Family Medicine

## 2015-08-19 ENCOUNTER — Ambulatory Visit (INDEPENDENT_AMBULATORY_CARE_PROVIDER_SITE_OTHER): Payer: 59 | Admitting: Family Medicine

## 2015-08-19 VITALS — BP 133/72 | HR 89 | Wt 170.0 lb

## 2015-08-19 DIAGNOSIS — F411 Generalized anxiety disorder: Secondary | ICD-10-CM | POA: Diagnosis not present

## 2015-08-19 DIAGNOSIS — R635 Abnormal weight gain: Secondary | ICD-10-CM

## 2015-08-19 DIAGNOSIS — Z6833 Body mass index (BMI) 33.0-33.9, adult: Secondary | ICD-10-CM

## 2015-08-19 MED ORDER — ESCITALOPRAM OXALATE 10 MG PO TABS: 10.0000 mg | ORAL_TABLET | Freq: Every day | ORAL | Status: DC

## 2015-08-19 MED FILL — ESCITALOPRAM 10 MG TABLET: 10 | 90 days supply | Qty: 90 | Fill #0

## 2015-08-19 NOTE — Patient Instructions (Signed)
[employer]itness [employer] - smart phone app. 

## 2015-08-19 NOTE — Progress Notes (Signed)
   Subjective:    Patient ID: Candice Robertson, female    DOB: [birth year], 30 y.o.   MRN: [id number]  HPI Anxiety - she is doing well. She is very happy with her current regimen. Her baby is now 26 months old and doing well. She is still nursing some just at night but plans on weaning off.   She also would like to discuss her weight today. She's frustrated that she's not been able to lose the baby weighed as much as she would like. She is currently taking the minipill. She's been trying to get some exercise and has been trying to watch her diet overall.   Review of Systems     Objective:   Physical Exam  Constitutional: She is oriented to person, place, and time. She appears well-developed and well-nourished.  HENT:  Head: Normocephalic and atraumatic.  Cardiovascular: Normal rate, regular rhythm and normal heart sounds.   Pulmonary/Chest: Effort normal and breath sounds normal.  Neurological: She is alert and oriented to person, place, and time.  Skin: Skin is warm and dry.  Psychiatric: She has a normal mood and affect. Her behavior is normal.          Assessment & Plan:  Anxiety-doing very well. Dad 7 score is 3 which looks fantastic. Previous score was 3. Continue current regimen and follow-up in 3 months. Encouraged her to think about whether or not she wants to work on weaning the medication when I see her back at that time.  Abnormal weight gain/BMI 33-discussed options. I recommended that she start with using a calorie counter such as my fitness pal to start phone application to help retract calories. When she has completed her nursing she would be able to come off of the minipill. Her husband also had a vasectomy he just has to go back for his recheck. I think that may help as well. And if at that point she still struggling losing weight could certainly consider talking to her about weight loss medication options and nutrition counseling.

## 2015-10-21 ENCOUNTER — Telehealth: Payer: Self-pay | Admitting: *Deleted

## 2015-10-21 NOTE — Telephone Encounter (Signed)
Pt called the after hours line and stated that she had forgotten to take he lexapro for about a wk or so. Spoke with Dr. Ileene Rubens he advised that she start with a 1/2 tab 5mg   for 5 days and go up to 10 mg after that. She was advised that if she is not feeling any better to call back. She voiced understanding and agreed.Audelia Hives [place]

## 2016-04-28 ENCOUNTER — Encounter: Payer: Self-pay | Admitting: Family Medicine

## 2016-04-28 ENCOUNTER — Ambulatory Visit (INDEPENDENT_AMBULATORY_CARE_PROVIDER_SITE_OTHER): Payer: 59 | Admitting: Family Medicine

## 2016-04-28 VITALS — BP 122/75 | HR 78 | Ht 60.0 in | Wt 164.0 lb

## 2016-04-28 DIAGNOSIS — J069 Acute upper respiratory infection, unspecified: Secondary | ICD-10-CM | POA: Diagnosis not present

## 2016-04-28 DIAGNOSIS — B9789 Other viral agents as the cause of diseases classified elsewhere: Secondary | ICD-10-CM

## 2016-04-28 DIAGNOSIS — Z01419 Encounter for gynecological examination (general) (routine) without abnormal findings: Secondary | ICD-10-CM | POA: Diagnosis not present

## 2016-04-28 DIAGNOSIS — F411 Generalized anxiety disorder: Secondary | ICD-10-CM | POA: Diagnosis not present

## 2016-04-28 DIAGNOSIS — Z6831 Body mass index (BMI) 31.0-31.9, adult: Secondary | ICD-10-CM | POA: Diagnosis not present

## 2016-04-28 DIAGNOSIS — Z1151 Encounter for screening for human papillomavirus (HPV): Secondary | ICD-10-CM | POA: Diagnosis not present

## 2016-04-28 NOTE — Progress Notes (Signed)
Subjective:    CC: GAD  HPI:  GAD - 6 months mood.  He actually weaned off her Lexapro in September. She was working with her therapist while doing this. She's done fantastic and has been happy without it. She says she has not had to use her rescue medicine at all.  She has had an upper respiratory infection for about for 5 days. Mostly with cough and congestion. She says it's been going around her entire family. She's mostly been drinking hot beverages and did take some Mucinex today. No fevers.  Past medical history, Surgical history, Family history not pertinant except as noted below, Social history, Allergies, and medications have been entered into the medical record, reviewed, and corrections made.   Review of Systems: No fevers, chills, night sweats, weight loss, chest pain, or shortness of breath.   Objective:    General: Well Developed, well nourished, and in no acute distress.  Neuro: Alert and oriented x3, extra-ocular muscles intact, sensation grossly intact.  HEENT: Normocephalic, atraumaticArthrosis clear, TMs and canals are clear bilaterally. No significant cervical lymphadenopathy.  Skin: Warm and dry, no rashes. Cardiac: Regular rate and rhythm, no murmurs rubs or gallops, no lower extremity edema.  Respiratory: Clear to auscultation bilaterally. Not using accessory muscles, speaking in full sentences.   Impression and Recommendations:    GAD- Well controlled. GAD 7 score of 1 today off of the medication. We'll go ahead and remove it from her medication list. They're in the future we can always restart it if needed.  URI - likely viral. I'll if not better in a week.

## 2016-04-29 ENCOUNTER — Telehealth: Payer: Self-pay | Admitting: Family Medicine

## 2016-04-29 NOTE — Telephone Encounter (Signed)
Plesae call pt and find out who did last pap smear and see if we can get copy.  I think she has an appt coming up soon with gyn

## 2016-04-30 NOTE — Telephone Encounter (Signed)
Spoke with Pt, she had her Pap at [facility] on 04/28/16. Called clinic, they will fax over report.

## 2016-04-30 NOTE — Telephone Encounter (Signed)
HM updated

## 2016-07-22 ENCOUNTER — Telehealth: Payer: Self-pay

## 2016-07-22 NOTE — Telephone Encounter (Signed)
Pt called complaining of a headache and stated her systolic pressure has been running in the 150/160 range. Pt's stated her BP was 150/74 this morning. Pt denies chest pain. Pt stated that she does have some dizziness but she has had it for a long time. Pt stated that she has an appt tomorrow at 1:15. Pt stated she was just going to wait to come in tomorrow. Pt advised to go to ED if she develops any chest pain. FYI

## 2016-07-23 ENCOUNTER — Telehealth: Payer: Self-pay | Admitting: Family Medicine

## 2016-07-23 ENCOUNTER — Encounter: Payer: Self-pay | Admitting: Family Medicine

## 2016-07-23 ENCOUNTER — Ambulatory Visit (INDEPENDENT_AMBULATORY_CARE_PROVIDER_SITE_OTHER): Payer: 59 | Admitting: Family Medicine

## 2016-07-23 VITALS — BP 142/73 | HR 101 | Ht 60.0 in | Wt 165.0 lb

## 2016-07-23 DIAGNOSIS — R030 Elevated blood-pressure reading, without diagnosis of hypertension: Secondary | ICD-10-CM | POA: Diagnosis not present

## 2016-07-23 DIAGNOSIS — G4484 Primary exertional headache: Secondary | ICD-10-CM | POA: Diagnosis not present

## 2016-07-23 DIAGNOSIS — G44219 Episodic tension-type headache, not intractable: Secondary | ICD-10-CM

## 2016-07-23 DIAGNOSIS — R42 Dizziness and giddiness: Secondary | ICD-10-CM

## 2016-07-23 LAB — CBC WITH DIFFERENTIAL/PLATELET
Basophils Absolute: 0 cells/uL (ref 0–200)
Basophils Relative: 0 %
EOS PCT: 2 %
Eosinophils Absolute: 150 cells/uL (ref 15–500)
HCT: 41.4 % (ref 35.0–45.0)
HEMOGLOBIN: 13.6 g/dL (ref 11.7–15.5)
LYMPHS ABS: 1800 {cells}/uL (ref 850–3900)
Lymphocytes Relative: 24 %
MCH: 28.4 pg (ref 27.0–33.0)
MCHC: 32.9 g/dL (ref 32.0–36.0)
MCV: 86.4 fL (ref 80.0–100.0)
MPV: 10.5 fL (ref 7.5–12.5)
Monocytes Absolute: 375 cells/uL (ref 200–950)
Monocytes Relative: 5 %
NEUTROS ABS: 5175 {cells}/uL (ref 1500–7800)
Neutrophils Relative %: 69 %
Platelets: 265 10*3/uL (ref 140–400)
RBC: 4.79 MIL/uL (ref 3.80–5.10)
RDW: 13.3 % (ref 11.0–15.0)
WBC: 7.5 10*3/uL (ref 3.8–10.8)

## 2016-07-23 NOTE — Telephone Encounter (Signed)
----- Message from [name], RN sent at 07/21/2016  2:37 PM EST ----- Regarding: RE: Continued dizziness/lightheadednes Hey Dr. [name], I have had worsening pressure/pounding headaches with movement and checked my BP manually and it was 164/90. Not sure what could be the cause of this.  I am not on any medications and have not changed anything to my normal routine. Any suggestions? I got in your next appointment which was Thursday at 1 and wanted to make sure this was okay to wait and go to work until then.  Renee Pain   RN, BSN [facility] [place] Phone: [phone] Fax: [phone] [email or web address]  ----- Message ----- From: [name], MD Sent: 07/15/2016   3:43 PM To: [name], RN Subject: RE: Continued dizziness/lightheadednes         Sounds good!  I will see you soon.   ----- Message ----- From: [name], RN Sent: 07/13/2016   3:28 PM To: [name], MD Subject: RE: Continued dizziness/lightheadednes         We did rule out orthostatics.  EKG is not obtainable here without an order (our department does not personally have one).  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  I do still have them almost every day, but they don't seem to correlate with the lightheaded/dizziness/headaches. I also made my first eye appointment exam in March.  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 I will call your office and arrange for a follow up with you just to check things out and help "cross the T's", thanks Dr. [name]!  Renee Pain   RN, BSN [facility] [place] Phone: [phone] Fax:  [phone] [email or web address]  ----- Message ----- From: [name], MD Sent: 07/10/2016   3:15 PM To: [name], RN Subject: RE: Continued dizziness/lightheadednes         Hi Jasma,   There are several possibilities.  Do you think you have someone there who could do orthostatics on you just to make sure that those are normal. That's usually one of the first things we do. We also usually do an EKG just to make sure there is no underlying arrhythmia so that when you exercise or press yourself or go upstairs and your pulse increases that it's not triggering lightheadedness because of the heart. It certainly could be positional vertigo. There is a maneuver caught a Dix-Hallpike maneuver that can be done to try to see if it re-creates her symptoms to help make that diagnosis.  Sometimes nasal pressure congestion can cause lightheadedness as well. Sometimes will try 2 weeks of a product like Flonase or Nasonex first to see if that relieves any eustachian pressure and that improves dizziness. Certainly could try to feed like before making an appointment. I had this helps and I hope you start feeling better.  Dr. Jerilynn Mages    ----- Message ----- From: [name], RN Sent: 07/09/2016   1:15 PM To: [name], MD Subject: Continued dizziness/lightheadednes             Good afternoon Dr. [name], I hope this message finds you well!  I just  wanted to touch base with you before making an appointment to see if there were things I could try/rule out first. I have had continued dizziness/lightheadedness that has persisted since postpartum 16 months ago.  It is worse with walking, especially after going up/down stairs, and at times when I turn my head.  It seems similar to the s/s I hear about vertigo.  I do also continue to have occasional ringing in ears for 5-10 seconds and headaches (worse riding in a car). Any suggestions to try/rule out?  If not I did not know if I  should be seen at your office or maybe referred to ENT? Thanks for your help!  Renee Pain   RN, BSN [facility] [place] Phone: [phone] Fax: [phone] [email or web address]

## 2016-07-23 NOTE — Progress Notes (Signed)
  Subjective:    CC: dizziness and high BP  HPI:  31 year old female with a history of preeclampsia comes in today complaining of not feeling well and episodes of elevated blood pressure. She also complains of episodes of dizziness and lightheadedness. 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 She's also had more frequent headaches and palpitations. The headaches do seem to coincide with the elevated blood pressure levels checked been running in the 0000000 to [id number] systolic. She has a history of palpitations but she says it's been they've been more frequent happening almost on a daily basis. She did have someone at work do orthostatics on her and they were normal. No new meds or changes to meds.    Feels she is sleeping well.  She has been a little more stressed recently.    Past medical history, Surgical history, Family history not pertinant except as noted below, Social history, Allergies, and medications have been entered into the medical record, reviewed, and corrections made.   Review of Systems: No fevers, chills, night sweats, weight loss, chest pain, or shortness of breath.   Objective:    General: Well Developed, well nourished, and in no acute distress.  Neuro: Alert and oriented x3, extra-ocular muscles intact, sensation grossly intact.  HEENT: Normocephalic, atraumatic  Skin: Warm and dry, no rashes. Cardiac: Regular rate and rhythm, no murmurs rubs or gallops, no lower extremity edema.  Respiratory: Clear to auscultation bilaterally. Not using accessory muscles, speaking in full sentences.   Impression and Recommendations:    Elevated blood pressure- midly elevated today.  Recheck BP in 1-2 weeks. Start regular exercise and low salt diet. Check aldosterone level.    Dizziness- check labs to eval for anemia nd thyroid d/o.   Palpitations/PVCs- consider cardiac monitor.  EKG today shows rate of 81 bpm, NSR with no acute changes. Normal axis.    HA - may be related to  BP or stress levels.

## 2016-07-23 NOTE — Telephone Encounter (Signed)
Patient has appt today.

## 2016-07-24 LAB — COMPLETE METABOLIC PANEL WITH GFR
ALT: 31 U/L — AB (ref 6–29)
AST: 20 U/L (ref 10–30)
Albumin: 4.9 g/dL (ref 3.6–5.1)
Alkaline Phosphatase: 57 U/L (ref 33–115)
BILIRUBIN TOTAL: 0.3 mg/dL (ref 0.2–1.2)
BUN: 12 mg/dL (ref 7–25)
CO2: 25 mmol/L (ref 20–31)
CREATININE: 0.81 mg/dL (ref 0.50–1.10)
Calcium: 9.7 mg/dL (ref 8.6–10.2)
Chloride: 102 mmol/L (ref 98–110)
GFR, Est African American: >89 mL/min (ref >=60)
GFR, Est Non African American: >89 mL/min (ref >=60)
GLUCOSE: 141 mg/dL — AB (ref 65–99)
Potassium: 4.3 mmol/L (ref 3.5–5.3)
SODIUM: 137 mmol/L (ref 135–146)
TOTAL PROTEIN: 7.8 g/dL (ref 6.1–8.1)

## 2016-07-24 LAB — TSH: TSH: 1.8 m[IU]/L

## 2016-07-27 ENCOUNTER — Other Ambulatory Visit: Payer: Self-pay | Admitting: Family Medicine

## 2016-07-27 ENCOUNTER — Telehealth: Payer: Self-pay

## 2016-07-27 DIAGNOSIS — R51 Headache: Secondary | ICD-10-CM

## 2016-07-27 DIAGNOSIS — R42 Dizziness and giddiness: Secondary | ICD-10-CM

## 2016-07-27 DIAGNOSIS — R519 Headache, unspecified: Secondary | ICD-10-CM

## 2016-07-27 LAB — ALDOSTERONE + RENIN ACTIVITY W/ RATIO
ALDO / PRA RATIO: 1.3 ratio (ref 0.9–28.9)
ALDOSTERONE: 5 ng/dL
PRA LC/MS/MS: 3.95 ng/mL/h (ref 0.25–5.82)

## 2016-07-27 NOTE — Telephone Encounter (Signed)
Please schedule for MRI with and without contrast for further evaluation. Will likely need prior authorization. Which she able to check her blood pressure today?

## 2016-07-27 NOTE — Telephone Encounter (Signed)
MRI ordered and patient aware.

## 2016-07-27 NOTE — Telephone Encounter (Signed)
Candice Robertson called and states the headaches and lightheadedness is getting worse. She does have some pressure in her ears. Denies chest pain, shortness of breath or pressure in face. What is the next step? Please advise.

## 2016-07-28 NOTE — Addendum Note (Signed)
Addended by: Huel Cote on: 07/28/2016 10:10 AM   Modules accepted: Orders

## 2016-07-31 ENCOUNTER — Telehealth: Payer: Self-pay

## 2016-07-31 ENCOUNTER — Other Ambulatory Visit: Payer: 59

## 2016-07-31 MED ORDER — BUSPIRONE HCL 5 MG PO TABS: 2.5000 mg | ORAL_TABLET | Freq: Two times a day (BID) | ORAL | 99 refills | Status: DC | PRN

## 2016-07-31 MED FILL — busPIRone HCL 5 MG TABS: 5 | 30 days supply | Qty: 30 | Fill #0

## 2016-07-31 NOTE — Telephone Encounter (Signed)
Okay to fill? 

## 2016-07-31 NOTE — Telephone Encounter (Signed)
rx sent.Candice Robertson [place]  

## 2016-07-31 NOTE — Telephone Encounter (Signed)
Pt called requesting a refill of buspar.  This med was taken off her med list back in Nov. 2017. Please advise.

## 2016-08-04 DIAGNOSIS — H52223 Regular astigmatism, bilateral: Secondary | ICD-10-CM | POA: Diagnosis not present

## 2016-08-07 ENCOUNTER — Other Ambulatory Visit: Payer: 59

## 2016-08-07 ENCOUNTER — Telehealth: Payer: Self-pay | Admitting: Family Medicine

## 2016-08-07 MED ORDER — FLUOXETINE HCL 10 MG PO CAPS: 10.0000 mg | ORAL_CAPSULE | Freq: Every day | ORAL | 1 refills | Status: DC

## 2016-08-07 MED FILL — FLUoxetine HCL 10 MG CAPS: 10 | 20 days supply | Qty: 30 | Fill #0

## 2016-08-07 NOTE — Telephone Encounter (Signed)
-----   Message from [name], [place] sent at 08/06/2016  1:43 PM EST ----- Hey Dr. [name], Just wanted to follow up with you on my dizziness, headaches, etc. My eye exam did show bilateral astigmatism requiring prescription glasses.  I am still waiting on them to come in.  Each day seems to get a little worse for me with the blurry vision, dizziness, and headaches, so much so that now my anxiety attacks are ramping back up. My MRI could not be scheduled for another 2-3 weeks as well which worries me if something more serious is going on, contributing to the anxiety attacks as well (being a nurse doesn't help either haha!). I am unsure whether I may need to restart a low dose lexapro back (I stopped in September as I started having symptoms similar to when I started it-headache, loose stool, nausea) or try taking my PRN buspar daily until I get my glasses to see if once I resolve the eye issue maybe my anxiety will decrease as well? I hesitate restarting the lexapro.  It made my anxiety attacks go away, and I was generally happy, but I also had intrusive thoughts that scared me (purposefully dropping my kids, falling off a balcony, falling down stairs, etc) that I could not distract my mind off of.  On the other hand, being in a constant state of anxiety and at times being tearful/overwhelemed is exhausting me and keeping me from being the mom and wife I need to be. Thoughts?  Renee Pain   RN, BSN [facility] [place] Phone: [phone] Fax: [phone] [email or web address]

## 2016-08-17 ENCOUNTER — Ambulatory Visit
Admission: RE | Admit: 2016-08-17 | Discharge: 2016-08-17 | Disposition: A | Discharge status: Home / Self Care | Payer: 59 | Source: Ambulatory Visit | Attending: Family Medicine | Admitting: Family Medicine

## 2016-08-17 DIAGNOSIS — R51 Headache: Secondary | ICD-10-CM | POA: Diagnosis not present

## 2016-08-17 DIAGNOSIS — R42 Dizziness and giddiness: Secondary | ICD-10-CM

## 2016-08-17 DIAGNOSIS — R519 Headache, unspecified: Secondary | ICD-10-CM

## 2016-08-17 MED ORDER — GADOBENATE DIMEGLUMINE 529 MG/ML IV SOLN
15.0000 mL | Freq: Once | INTRAVENOUS | Status: AC | PRN
  Administered 2016-08-17: 15 mL via INTRAVENOUS

## 2016-08-31 DIAGNOSIS — R4589 Other symptoms and signs involving emotional state: Secondary | ICD-10-CM | POA: Diagnosis not present

## 2016-09-09 MED FILL — ESCITALOPRAM 5 MG TABLET: 5 | 30 days supply | Qty: 30 | Fill #0

## 2016-09-16 DIAGNOSIS — F4323 Adjustment disorder with mixed anxiety and depressed mood: Secondary | ICD-10-CM | POA: Diagnosis not present

## 2016-09-23 ENCOUNTER — Telehealth: Payer: Self-pay | Admitting: Family Medicine

## 2016-09-24 NOTE — Telephone Encounter (Signed)
error 

## 2016-09-30 ENCOUNTER — Telehealth: Payer: Self-pay | Admitting: Family Medicine

## 2016-09-30 NOTE — Telephone Encounter (Signed)
RE: how are you feeling?  Received: 6 days ago  Message Contents  Candice Berkshire, RN  Candice Marry, MD        Hey Dr. Madilyn Robertson, thanks for checking in on me!  Things are going good overall, I still feel this is more of a hormonal/anxiety component.  I haven't started the SSRI. I can't bring myself to do it. It helped me in a dark time postpartum where I wouldn't leave my house with PPD/PPA, but I don't feel like I'm anywhere close to that. I enjoy going to work and being active at home and doing things with my kids. The SSRI got me out of the PPD/PPA but I still had to find a way to adjust and cope with side effects.  The headaches have subsided and I assume I have now adjusted to my glasses and that was the culprit.  I do still have the issue of having worse symptoms similar to my anxiety after working out, and I wonder if I'm just pushing myself too hard. I walk around the hospital or on the treadmill in our gym up to a mile, and do great up until the mile marker where I start getting lightheaded and my legs feel heavy as if I could faint. My HR usually gets in the 150's or so. I don't get markedly SOB or sweat more than normal. I can't tell if its adrenaline from exercising causing anxiety or if I am just out of shape/overweight and pushing myself too much, or both! I also having that rushing/heart beat sensation to my head for a few seconds when I get up from bending over or go up stairs.  I met with Candice Robertson as well to get her thoughts and she seems to think it sounds biochemical as I also have severe mood swings around my cycle and feel more "wound up" in the mornings that wears off as the day goes on.  I also saw Candice Robertson a few weeks ago to see if he would check some hormone panels but he refused. My BP in his office was 120s/80s.  It's been super frustrating, but for me I feel SSRI is the last option on my list, as I have lots of good days and it's not interfering  with my routines. I wonder if any hormones or vitamins could be out of wack. I never had this before I was pregnant with my second child, and just feel like she reset something in my body that I can't get back to normal.   Thanks for thinking of me!  Candice Pain  RN, BSN  [facility] [place]  Phone: [phone]  Fax: [phone]  [email or web address]   Previous Messages    ----- Message -----  From: Candice Marry, MD  Sent: 09/23/2016  3:44 PM  To: Candice Berkshire, RN  Subject: how are you feeling?               Hi Candice Robertson, just wanted to check in on you and see how blood pressure is doing and if you want to move forwards with a cardiac monitor? What Do you think? Are you feeling any better? Are headaches better?   Dr. Jerilynn Mages

## 2016-10-02 ENCOUNTER — Other Ambulatory Visit: Payer: Self-pay | Admitting: Family Medicine

## 2016-10-02 DIAGNOSIS — R4586 Emotional lability: Secondary | ICD-10-CM

## 2016-10-02 DIAGNOSIS — R5383 Other fatigue: Secondary | ICD-10-CM

## 2016-10-02 NOTE — Progress Notes (Signed)
Candice G Achille, RN  Candice D Metheney, MD        I would be interested in checking some hormone levels, as well as b12. Are there ways to check serotonin and norepi levels? It would be nice to find the culprit of these random fluctuating mood swings that haven't resolved since my second pregnancy that seem to be the worst after my cycles.  I would really like to explore supplements and CBT to help support mood before going to the SSRIs. Like I said, the SSRI helped me postpartum when I wouldn't leave my house, but now I am able to function day to day and just need some support to get through the random waves of mood swings. The SSRIs helped with anxiety and dizziness, but gave me other symptoms, and eventually I had to go up on the dose once my body got used to the lower dose, and I just feel long term it wouldn't fix the underlying issue, and I would always be chasing another dose or medication once my body got used to it.  It's so frustrating dealing with this since my second pregnancy. Before I did have some anxiety that was more thought-based, but nothing physical or mood swing related at all. It's like the pregnancy threw something "off" chemically/hormonally that I just can't get back to normal. I know SSRIs could fix this in some regard, but I didn't enjoy the SSRIs and just used it to get back into my routine short term.  I hate that I feel like I'm being difficult about this as well, so I apologize!   Candice Robertson, Candice Robertson  RN, BSN  [facility]  [place]  Phone: [phone]  Fax: [phone]  [email or web address]   Previous Messages    ----- Message -----  From: Candice D Metheney, MD  Sent: 09/30/2016  2:26 PM  To: Candice G Guida, RN  Subject: FW: how are you feeling?             Hi Candice Robertson,   It can be biochemical where the serotonin and norepinephrine chemicals are off in the brain. That's actually were medication can be  truly helpful. Some people do have a chemical imbalance that makes things just seem off and sometimes an SSRI can actually be very helpful with this but I do understand your reservations in taking medication. We could always check your hormone levels but I'll be honest, if you're still having fairly regular periods then is unlikely to show anything significant. The biggest indicator of normal hormone levels are regular menstrual cycles. But I'll be happy to check things if you would like. I can always send a lab slip down stairs. Also if you feel like her heart is working and racing too hard we could always consider a stress test as an option just to make sure that your heart is healthy enough to exercise.    It does sound like some things are better which is great.   Let me know what you think.   Dr. M   ----- Message -----  From: Candice G Boss, RN  Sent: 09/24/2016  9:16 AM  To: Candice D Metheney, MD  Subject: RE: how are you feeling?             Hey Dr. Metheney, thanks for checking in on me!  Things are going good overall, I still feel this is more of a hormonal/anxiety component.  I haven't started the SSRI. I can't bring   myself to do it. It helped me in a dark time postpartum where I wouldn't leave my house with PPD/PPA, but I don't feel like I'm anywhere close to that. I enjoy going to work and being active at home and doing things with my kids. The SSRI got me out of the PPD/PPA but I still had to find a way to adjust and cope with side effects.  The headaches have subsided and I assume I have now adjusted to my glasses and that was the culprit.  I do still have the issue of having worse symptoms similar to my anxiety after working out, and I wonder if I'm just pushing myself too hard. I walk around the hospital or on the treadmill in our gym up to a mile, and do great up until the mile marker where I start getting lightheaded and my legs feel heavy as if I could faint.  My HR usually gets in the 150's or so. I don't get markedly SOB or sweat more than normal. I can't tell if its adrenaline from exercising causing anxiety or if I am just out of shape/overweight and pushing myself too much, or both! I also having that rushing/heart beat sensation to my head for a few seconds when I get up from bending over or go up stairs.  I met with Candice Robertson as well to get her thoughts and she seems to think it sounds biochemical as I also have severe mood swings around my cycle and feel more "wound up" in the mornings that wears off as the day goes on.  I also saw Candice Robertson a few weeks ago to see if he would check some hormone panels but he refused. My BP in his office was 120s/80s.  It's been super frustrating, but for me I feel SSRI is the last option on my list, as I have lots of good days and it's not interfering with my routines. I wonder if any hormones or vitamins could be out of wack. I never had this before I was pregnant with my second child, and just feel like she reset something in my body that I can't get back to normal.   Thanks for thinking of me!  Candice Pain  RN, BSN  [facility] [place]  Phone: [phone]  Fax: [phone]  [email or web address]    ----- Message -----  From: Candice Marry, MD  Sent: 09/23/2016  3:44 PM  To: Candice Berkshire, RN  Subject: how are you feeling?               Hi Candice Robertson, just wanted to check in on you and see how blood pressure is doing and if you want to move forwards with a cardiac monitor? What Do you think? Are you feeling any better? Are headaches better?   Candice Robertson

## 2016-10-21 ENCOUNTER — Other Ambulatory Visit: Payer: Self-pay | Admitting: Family Medicine

## 2016-10-21 ENCOUNTER — Telehealth: Payer: Self-pay | Admitting: Family Medicine

## 2016-10-21 DIAGNOSIS — R748 Abnormal levels of other serum enzymes: Secondary | ICD-10-CM

## 2016-10-21 DIAGNOSIS — R4586 Emotional lability: Secondary | ICD-10-CM

## 2016-10-21 NOTE — Telephone Encounter (Signed)
HI Sugar,  I'm not sure what happened. I checked on the referral. The note says it they attempted to call you but could not reach you because of an invalid phone number. Please just let me know if you have a new number I'm not sure what's going on. It says they were going to mail you a letter and ask you to call them.Please call [phone] and they can schedule you.  As far as the lab order it is good for 6 months before it expires so you should be good to go. I will see if we need to add anything for the cramping.  Cramping is most often caused by changes in activity. Say for example if you started an exercise regimen it can cause more muscle cramping and vice versa. If you are normally very active and then become more inactive it can cause more cramping. Making sure to that you're staying really well hydrated and that you're getting enough potassium in your diet.  DR. Jerilynn Mages    ===View-only below this line===  ----- Message ----- From: [name], RN Sent: 10/21/2016  11:13 AM To: [name], MD Subject: RE: how are you feeling?                       Hey Dr. Jerilynn Mages, Just wanted to see if there was a number I could call to set up the CBT referral apt up, I haven't heard from their office. Also, I wasn't able to get my labs drawn as they close during the lunch break and didn't know if we needed to resend the order down (didn't know when the order expired).  I can go by either tomorrow the 10th or next Tuesday the 15th. I've had bad intermittent leg cramps bilaterally with tingling in my feet like they are going to sleep as well.  Any thoughts or additional lab work you think would be warranted for that? Turning 30 has not been good to me obviously haha! Thanks!  Renee Pain   RN, BSN Advanced Heart Failure [place] Phone: [phone] Fax: [phone] [email or web address]  ----- Message ----- From: [name], MD Sent: 10/07/2016   1:54  PM To: [name], RN Subject: RE: how are you feeling?                       Hi, I just put in a new referral.  They should be contacting you soon. Let me know if helpful!    ----- Message ----- From: [name], RN Sent: 10/02/2016  10:43 AM To: [name], MD Subject: RE: how are you feeling?                       I would love that!  I am able to come by to do labs around 1230 today. And a referral for CBT would be wonderful.  [name] is great but I feel like we do mostly talking/counseling which I don't think is helpful at this point.  I am happy and don't feel anything emotionally, situational, or underlying is causing this other than biochemical, I just have these mood swings I need to figure out how to retrain my brain on how to handle! Thanks for your help and support as I try to figure this thing out, you have been so wonderful!  Renee Pain   RN, BSN Advanced Heart  Failure [place] Phone: [phone] Fax: [phone] [email or web address]  ----- Message ----- From: [name], MD Sent: 10/02/2016  10:13 AM To: [name], RN Subject: RE: how are you feeling?                       HI Jermesha, I will fax a lab slip downstairs for the hormone and B12.  Unfortunately we don't have a what of checking those very important brain chemicals. I wish we did!  I really like [place] at the [facility] location for some CBT.  Would you like a referral for that?  Have a great weekend!   Dr. Jerilynn Mages   ----- Message ----- From: [name], RN Sent: 10/02/2016   8:12 AM To: [name], MD Subject: RE: how are you feeling?                       I would be interested in checking some hormone levels, as well as b12.  Are there ways to check serotonin and norepi levels?  It would be nice to find the culprit of these random fluctuating mood swings that haven't resolved since my second pregnancy that  seem to be the worst after my cycles. I would really like to explore supplements and CBT to help support mood before going to the SSRIs.  Like I said, the SSRI helped me postpartum when I wouldn't leave my house, but now I am able to function day to day and just need some support to get through the random waves of mood swings.  The SSRIs helped with anxiety and dizziness, but gave me other symptoms, and eventually I had to go up on the dose once my body got used to the lower dose, and I just feel long term it wouldn't fix the underlying issue, and I would always be chasing another dose or medication once my body got used to it. It's so frustrating dealing with this since my second pregnancy.  Before I did have some anxiety that was more thought-based, but nothing physical or mood swing related at all.  It's like the pregnancy threw something "off" chemically/hormonally that I just can't get back to normal.  I know SSRIs could fix this in some regard, but I didn't enjoy the SSRIs and just used it to get back into my routine short term. I hate that I feel like I'm being difficult about this as well, so I apologize!  Renee Pain   RN, BSN Advanced Heart Failure [place] Phone: [phone] Fax: [phone] [email or web address]  ----- Message ----- From: [name], MD Sent: 09/30/2016   2:26 PM To: [name], RN Subject: FW: how are you feeling?                       Hi Cuma,   It can be biochemical where the serotonin and norepinephrine chemicals are off in the brain. That's actually were medication can be truly helpful. Some people do have a chemical imbalance that makes things just seem off and sometimes an SSRI can actually be very helpful with this but I do understand your reservations in taking medication. We could always check your hormone levels but I'll be honest, if you're still having fairly regular periods then is unlikely to show anything  significant. The biggest indicator of normal hormone levels are regular menstrual cycles. But I'll be  happy to check things if you would like. I can always send a lab slip down stairs. Also if you feel like her heart is working and racing too hard we could always consider a stress test as an option just to make sure that your heart is healthy enough to exercise.    It does sound like some things are better which is great.  Let me know what you think.   Dr. Jerilynn Mages   ----- Message ----- From: [name], RN Sent: 09/24/2016   9:16 AM To: [name], MD Subject: RE: how are you feeling?                       Hey Dr. [name], thanks for checking in on me! Things are going good overall, I still feel this is more of a hormonal/anxiety component. I haven't started the SSRI.  I can't bring myself to do it.  It helped me in a dark time postpartum where I wouldn't leave my house with PPD/PPA, but I don't feel like I'm anywhere close to that.  I enjoy going to work and being active at home and doing things with my kids.  The SSRI got me out of the PPD/PPA but I still had to find a way to adjust and cope with side effects. The headaches have subsided and I assume I have now adjusted to my glasses and that was the culprit. I do still have the issue of having worse symptoms similar to my anxiety after working out, and I wonder if I'm just pushing myself too hard.  I walk around the hospital or on the treadmill in our gym up to a mile, and do great up until the mile marker where I start getting lightheaded and my legs feel heavy as if I could faint.  My HR usually gets in the 150's or so.  I don't get markedly SOB or sweat more than normal.  I can't tell if its adrenaline from exercising causing anxiety or if I am just out of shape/overweight and pushing myself too much, or both!  I also having that rushing/heart beat sensation to my head for a few seconds when I get up from bending over or go up  stairs. I met with [name] as well to get her thoughts and she seems to think it sounds biochemical as I also have severe mood swings around my cycle and feel more "wound up" in the mornings that wears off as the day goes on. I also saw Dr. [name] a few weeks ago to see if he would check some hormone panels but he refused.  My BP in his office was 120s/80s. It's been super frustrating, but for me I feel SSRI is the last option on my list, as I have lots of good days and it's not interfering with my routines.  I wonder if any hormones or vitamins could be out of wack.  I never had this before I was pregnant with my second child, and just feel like she reset something in my body that I can't get back to normal.  Thanks for thinking of me! Renee Pain   RN, BSN Advanced Heart Failure [place] Phone: [phone] Fax: [phone] [email or web address]   ----- Message ----- From: [name], MD Sent: 09/23/2016   3:44 PM To: [name], RN Subject: how are you feeling?  Hi Margaretha, just wanted to check in on you and see how blood pressure is doing and if you want to move forwards with a cardiac monitor?  What  Do you think?  Are you feeling any better? Are headaches better?  Dr. Jerilynn Mages

## 2016-10-21 NOTE — Telephone Encounter (Signed)
Referral was placed on wrong patient will place new referral today. I let her know.

## 2016-11-04 MED FILL — busPIRone HCL 5 MG TABS: 5 | 30 days supply | Qty: 30 | Fill #1

## 2016-12-03 ENCOUNTER — Ambulatory Visit (INDEPENDENT_AMBULATORY_CARE_PROVIDER_SITE_OTHER): Payer: 59 | Admitting: Licensed Clinical Social Worker

## 2016-12-03 DIAGNOSIS — F419 Anxiety disorder, unspecified: Secondary | ICD-10-CM | POA: Diagnosis not present

## 2016-12-26 ENCOUNTER — Encounter (HOSPITAL_COMMUNITY): Payer: Self-pay | Admitting: Emergency Medicine

## 2016-12-26 ENCOUNTER — Emergency Department (HOSPITAL_COMMUNITY): Payer: 59

## 2016-12-26 ENCOUNTER — Emergency Department (HOSPITAL_COMMUNITY)
Admission: EM | Admit: 2016-12-26 | Discharge: 2016-12-26 | Disposition: A | Discharge status: Home / Self Care | Payer: 59 | Attending: Emergency Medicine | Admitting: Emergency Medicine

## 2016-12-26 ENCOUNTER — Telehealth: Payer: Self-pay | Admitting: Emergency Medicine

## 2016-12-26 ENCOUNTER — Encounter: Payer: Self-pay | Admitting: Emergency Medicine

## 2016-12-26 ENCOUNTER — Emergency Department (INDEPENDENT_AMBULATORY_CARE_PROVIDER_SITE_OTHER)
Admission: EM | Admit: 2016-12-26 | Discharge: 2016-12-26 | Disposition: A | Discharge status: Home / Self Care | Payer: 59 | Source: Home / Self Care | Attending: Family Medicine | Admitting: Family Medicine

## 2016-12-26 DIAGNOSIS — F419 Anxiety disorder, unspecified: Secondary | ICD-10-CM | POA: Diagnosis not present

## 2016-12-26 DIAGNOSIS — Z87891 Personal history of nicotine dependence: Secondary | ICD-10-CM | POA: Diagnosis not present

## 2016-12-26 DIAGNOSIS — R1013 Epigastric pain: Secondary | ICD-10-CM

## 2016-12-26 DIAGNOSIS — R531 Weakness: Secondary | ICD-10-CM

## 2016-12-26 DIAGNOSIS — R110 Nausea: Secondary | ICD-10-CM

## 2016-12-26 DIAGNOSIS — I10 Essential (primary) hypertension: Secondary | ICD-10-CM | POA: Insufficient documentation

## 2016-12-26 DIAGNOSIS — R197 Diarrhea, unspecified: Secondary | ICD-10-CM

## 2016-12-26 DIAGNOSIS — R109 Unspecified abdominal pain: Secondary | ICD-10-CM | POA: Diagnosis not present

## 2016-12-26 LAB — COMPREHENSIVE METABOLIC PANEL
ALBUMIN: 4.6 g/dL (ref 3.5–5.0)
ALK PHOS: 50 U/L (ref 38–126)
ALT: 22 U/L (ref 14–54)
ANION GAP: 11 (ref 5–15)
AST: 20 U/L (ref 15–41)
BUN: 8 mg/dL (ref 6–20)
CALCIUM: 9.8 mg/dL (ref 8.9–10.3)
CO2: 23 mmol/L (ref 22–32)
Chloride: 106 mmol/L (ref 101–111)
Creatinine, Ser: 0.66 mg/dL (ref 0.44–1.00)
GFR calc non Af Amer: >60 mL/min (ref >60)
Glucose, Bld: 107 mg/dL — ABNORMAL HIGH (ref 65–99)
POTASSIUM: 4.1 mmol/L (ref 3.5–5.1)
SODIUM: 140 mmol/L (ref 135–145)
Total Bilirubin: 0.4 mg/dL (ref 0.3–1.2)
Total Protein: 7.5 g/dL (ref 6.5–8.1)

## 2016-12-26 LAB — URINALYSIS, ROUTINE W REFLEX MICROSCOPIC
Bilirubin Urine: NEGATIVE
Glucose, UA: NEGATIVE mg/dL
HGB URINE DIPSTICK: NEGATIVE
Ketones, ur: NEGATIVE mg/dL
Leukocytes, UA: NEGATIVE
NITRITE: NEGATIVE
PH: 7 (ref 5.0–8.0)
Protein, ur: NEGATIVE mg/dL
SPECIFIC GRAVITY, URINE: 1.003 — AB (ref 1.005–1.030)

## 2016-12-26 LAB — CBC
HEMATOCRIT: 41.7 % (ref 36.0–46.0)
HEMOGLOBIN: 13.7 g/dL (ref 12.0–15.0)
MCH: 28.2 pg (ref 26.0–34.0)
MCHC: 32.9 g/dL (ref 30.0–36.0)
MCV: 86 fL (ref 78.0–100.0)
Platelets: 238 10*3/uL (ref 150–400)
RBC: 4.85 MIL/uL (ref 3.87–5.11)
RDW: 13.1 % (ref 11.5–15.5)
WBC: 8.6 10*3/uL (ref 4.0–10.5)

## 2016-12-26 LAB — LIPASE, BLOOD: LIPASE: 25 U/L (ref 11–51)

## 2016-12-26 LAB — POC URINE PREG, ED: PREG TEST UR: NEGATIVE

## 2016-12-26 MED ORDER — SODIUM CHLORIDE 0.9 % IV BOLUS (SEPSIS): 1000.0000 mL | Freq: Once | INTRAVENOUS | Status: DC

## 2016-12-26 MED ORDER — GI COCKTAIL ~~LOC~~
30.0000 mL | Freq: Once | ORAL | Status: AC
  Administered 2016-12-26: 30 mL via ORAL
  Filled 2016-12-26: qty 30

## 2016-12-26 MED ORDER — OMEPRAZOLE 20 MG PO CPDR: 20.0000 mg | DELAYED_RELEASE_CAPSULE | Freq: Every day | ORAL | 0 refills | Status: DC

## 2016-12-26 MED ORDER — FAMOTIDINE IN NACL 20-0.9 MG/50ML-% IV SOLN
20.0000 mg | Freq: Once | INTRAVENOUS | Status: AC
  Administered 2016-12-26: 20 mg via INTRAVENOUS
  Filled 2016-12-26: qty 50

## 2016-12-26 MED ORDER — IOPAMIDOL (ISOVUE-300) INJECTION 61%
100.0000 mL | Freq: Once | INTRAVENOUS | Status: AC | PRN
  Administered 2016-12-26: 100 mL via INTRAVENOUS

## 2016-12-26 NOTE — Discharge Instructions (Signed)
As discussed, your evaluation today has been largely reassuring.  But, it is important that you monitor your condition carefully, and do not hesitate to return to the ED if you develop new, or concerning changes in your condition.  Your CT scan did demonstrate a likely incidental finding of a nerve sheath lesion.  (interpretation below)  IMPRESSION: 1. No CT evidence for acute intra-abdominal or pelvic pathology. 2. 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 Nonemergent MRI follow-up recommended.  Otherwise, please follow-up with your physician for appropriate ongoing care.

## 2016-12-26 NOTE — ED Provider Notes (Signed)
CSN: [id number]     Arrival date & time 12/26/16  1227 History   First MD Initiated Contact with Patient 12/26/16 1241     Chief Complaint  Patient presents with  . Abdominal Pain   (Consider location/radiation/quality/duration/timing/severity/associated sxs/prior Treatment) HPI  Candice Robertson is a 31 y.o. female presenting to UC with mother with c/o upper abdominal pain that started 1 week ago, associated nausea and diarrhea.  No vomiting.  She has had 5-6 episodes of very loose but not watery stools for 1 week.  Denies blood in stool. Denies fever or chills. She was at the beach 1 week ago but no sick contacts. She cannot think of anything she ate that may have started symptoms but she notes that eating anything makes pain worse.  She has been able to keep down fluids but feels generalized weakness and fatigue. She had a near syncopal episode earlier today, prompting her to come be evaluated.  Pt lives with her husband and young daughters but none of them have been sick.  No hx of abdominal surgeries. No urinary symptoms.  She has not tried any OTC medications for her symptoms.    Past Medical History:  Diagnosis Date  . Anxiety   . Anxiety disorder   . Breech presentation   . Dizziness   . GERD (gastroesophageal reflux disease)   . History of chicken pox   . History of gestational hypertension 10/14/2011  . Hypertension    With pregnancy only  . PP care - s/p 1C/S - breech 5/1 10/14/2011  . Tachycardia    with 2016 pregancy only   Past Surgical History:  Procedure Laterality Date  . CESAREAN SECTION  10/14/2011   Procedure: CESAREAN SECTION;  Surgeon: Lovenia Kim, MD;  Location: [facility];  Service: Gynecology;  Laterality: N/A;  Primary EDD: 10/21/11  . CESAREAN SECTION N/A 03/05/2015   Procedure: Repeat CESAREAN SECTION;  Surgeon: Brien Few, MD;  Location: [facility];  Service: Obstetrics;  Laterality: N/A;  EDD: 03/26/15   . TONSILECTOMY/ADENOIDECTOMY WITH MYRINGOTOMY  1991  .  TONSILLECTOMY    . TYMPANOSTOMY TUBE PLACEMENT  1989   Family History  Problem Relation Age of Onset  . Alcohol abuse Maternal Uncle   . Diabetes Maternal Uncle   . Heart attack Maternal Grandmother   . Depression Maternal Grandmother    Social History  Substance Use Topics  . Smoking status: Former Smoker    Quit date: 10/07/2009  . Smokeless tobacco: Never Used  . Alcohol use Yes     Comment: socially but none with pregnancy   OB History    Gravida Para Term Preterm AB Living   2 2 2     1    SAB TAB Ectopic Multiple Live Births         0 1     Review of Systems  Constitutional: Positive for fatigue. Negative for chills and fever.  HENT: Negative for congestion, ear pain, sore throat, trouble swallowing and voice change.   Respiratory: Negative for cough and shortness of breath.   Cardiovascular: Negative for chest pain and palpitations.  Gastrointestinal: Positive for abdominal pain (epigastric), diarrhea and nausea. Negative for vomiting.  Genitourinary: Negative for dysuria, frequency and hematuria.  Musculoskeletal: Negative for arthralgias, back pain and myalgias.  Skin: Negative for rash.  Neurological: Positive for weakness and light-headedness. Negative for dizziness, syncope and headaches.    Allergies  Codeine phosphate and Zoloft [sertraline hcl]  Home Medications  Prior to Admission medications   Medication Sig Start Date End Date Taking? Authorizing Provider  busPIRone (BUSPAR) 5 MG tablet Take 0.5 tablets (2.5 mg total) by mouth 2 (two) times daily as needed (anxiety). 07/31/16   [name], MD   Meds Ordered and Administered this Visit   Medications  sodium chloride 0.9 % bolus 1,000 mL (not administered)    BP (!) 174/92 (BP Location: Left Arm) Comment: 156/101  Pulse (!) 119   Temp 98.5 F (36.9 C) (Oral)   Resp 16   Wt 160 lb (72.6 kg)   LMP 12/17/2016 (Exact Date)   BMI 31.25 kg/m  No data found.   Physical Exam   Constitutional: She is oriented to person, place, and time. She appears well-developed and well-nourished. No distress.  HENT:  Head: Normocephalic and atraumatic.  Right Ear: Tympanic membrane normal.  Left Ear: Tympanic membrane normal.  Nose: Nose normal.  Mouth/Throat: Uvula is midline, oropharynx is clear and moist and mucous membranes are normal.  Eyes: EOM are normal.  Neck: Normal range of motion. Neck supple.  Cardiovascular: Regular rhythm.  Tachycardia present.   Pulmonary/Chest: Effort normal and breath sounds normal. No respiratory distress. She has no wheezes. She has no rales.  Musculoskeletal: Normal range of motion.  Neurological: She is alert and oriented to person, place, and time.  Skin: Skin is warm and dry. She is not diaphoretic.  Psychiatric: She has a normal mood and affect. Her behavior is normal.  Nursing note and vitals reviewed.   Urgent Care Course     Procedures (including critical care time)  Labs Review Labs Reviewed  COMPLETE METABOLIC PANEL WITH GFR  LIPASE  POCT CBC W AUTO DIFF ([place])    Imaging Review No results found.    MDM   1. Abdominal pain, epigastric   2. Nausea without vomiting   3. Diarrhea, unspecified type   4. Generalized weakness    Pt c/o generalized weakness, epigastric pain, nausea and diarrhea.   CBC- unremarkable Attempted to give IV fluids but unable to start IV in UC.  Pt states she would like to go to emergency department for further evaluation and treatment of symptoms.  CMP and Lipase were ordered, however, were NOT sent to lab as pt decided to go ED. They will be able to get blood work quicker there.   Pt taken to [facility] via POV by her mother.     Noe Gens, [place] 12/26/16 1352

## 2016-12-26 NOTE — Telephone Encounter (Signed)
Patient was sent to [facility] for further evaluation, lipase and CMP was drawn, but was not sent out due to being sent to ED.

## 2016-12-26 NOTE — ED Provider Notes (Signed)
[facility] DEPT Provider Note   CSN: [id number] Arrival date & time: 12/26/16  1356     History   Chief Complaint Chief Complaint  Patient presents with  . Abdominal Pain    HPI Candice Robertson is a 31 y.o. female.  HPI  Patient presents with concern of abdominal pain. Pain has been present for about one week, is in the epigastrium, sore, persistent, nonradiating. There is associated anorexia, nausea, diarrhea, but no vomiting. No other chest pain, no dyspnea. No medication taken for relief. Patient is generally well, denies substantial medical problems, denies history of abdominal surgery. Patient went to our affiliated urgent care, was sent here for evaluation.  Past Medical History:  Diagnosis Date  . Anxiety   . Anxiety disorder   . Breech presentation   . Dizziness   . GERD (gastroesophageal reflux disease)   . History of chicken pox   . History of gestational hypertension 10/14/2011  . Hypertension    With pregnancy only  . PP care - s/p 1C/S - breech 5/1 10/14/2011  . Tachycardia    with 2016 pregancy only    Patient Active Problem List   Diagnosis Date Noted  . GAD (generalized anxiety disorder) 08/19/2015  . Mild preeclampsia 03/07/2015  . Iron deficiency anemia of pregnancy 03/07/2015  . Acute blood loss anemia 03/07/2015  . Autonomic dysfunction 12/13/2014  . Near syncope 12/13/2014  . History of gestational hypertension 10/14/2011  . DELAYED MENSES 07/17/2010  . PALPITATIONS, OCCASIONAL 07/14/2010  . CERVICALGIA 01/09/2010    Past Surgical History:  Procedure Laterality Date  . CESAREAN SECTION  10/14/2011   Procedure: CESAREAN SECTION;  Surgeon: Lovenia Kim, MD;  Location: [facility];  Service: Gynecology;  Laterality: N/A;  Primary EDD: 10/21/11  . CESAREAN SECTION N/A 03/05/2015   Procedure: Repeat CESAREAN SECTION;  Surgeon: Brien Few, MD;  Location: [facility];  Service: Obstetrics;  Laterality: N/A;  EDD: 03/26/15   .  TONSILECTOMY/ADENOIDECTOMY WITH MYRINGOTOMY  1991  . TONSILLECTOMY    . TYMPANOSTOMY TUBE PLACEMENT  1989    OB History    Gravida Para Term Preterm AB Living   2 2 2     1    SAB TAB Ectopic Multiple Live Births         0 1       Home Medications    Prior to Admission medications   Medication Sig Start Date End Date Taking? Authorizing Provider  busPIRone (BUSPAR) 5 MG tablet Take 0.5 tablets (2.5 mg total) by mouth 2 (two) times daily as needed (anxiety). 07/31/16   [name], MD    Family History Family History  Problem Relation Age of Onset  . Alcohol abuse Maternal Uncle   . Diabetes Maternal Uncle   . Heart attack Maternal Grandmother   . Depression Maternal Grandmother     Social History Social History  Substance Use Topics  . Smoking status: Former Smoker    Quit date: 10/07/2009  . Smokeless tobacco: Never Used  . Alcohol use Yes     Comment: socially but none with pregnancy     Allergies   Codeine phosphate and Zoloft [sertraline hcl]   Review of Systems Review of Systems  Constitutional:       Per HPI, otherwise negative  HENT:       Per HPI, otherwise negative  Respiratory:       Per HPI, otherwise negative  Cardiovascular:       Per HPI,  otherwise negative  Gastrointestinal: Positive for abdominal pain, diarrhea and nausea. Negative for vomiting.  Endocrine:       Negative aside from HPI  Genitourinary:       Neg aside from HPI   Musculoskeletal:       Per HPI, otherwise negative  Skin: Negative.   Neurological: Positive for weakness. Negative for syncope.     Physical Exam Updated Vital Signs BP (!) 144/98   Pulse 87   Temp 98.4 F (36.9 C)   Resp 16   LMP 12/17/2016 (Exact Date)   SpO2 100%   Physical Exam  Constitutional: She is oriented to person, place, and time. She appears well-developed and well-nourished. No distress.  HENT:  Head: Normocephalic and atraumatic.  Eyes: Conjunctivae and EOM are normal.    Cardiovascular: Normal rate and regular rhythm.   Pulmonary/Chest: Effort normal and breath sounds normal. No stridor. No respiratory distress.  Abdominal: She exhibits no distension. There is tenderness in the epigastric area.  Musculoskeletal: She exhibits no edema.  Neurological: She is alert and oriented to person, place, and time. No cranial nerve deficit.  Skin: Skin is warm and dry.  Psychiatric: She has a normal mood and affect.  Nursing note and vitals reviewed.    ED Treatments / Results  Labs (all labs ordered are listed, but only abnormal results are displayed) Labs Reviewed  COMPREHENSIVE METABOLIC PANEL - Abnormal; Notable for the following:       Result Value   Glucose, Bld 107 (*)    All other components within normal limits  URINALYSIS, ROUTINE W REFLEX MICROSCOPIC - Abnormal; Notable for the following:    Color, Urine COLORLESS (*)    Specific Gravity, Urine 1.003 (*)    All other components within normal limits  LIPASE, BLOOD  CBC  POC URINE PREG, ED     Radiology Ct Abdomen Pelvis W Contrast  Result Date: 12/26/2016 CLINICAL DATA:  Upper abdominal pain EXAM: CT ABDOMEN AND PELVIS WITH CONTRAST TECHNIQUE: Multidetector CT imaging of the abdomen and pelvis was performed using the standard protocol following bolus administration of intravenous contrast. CONTRAST:  166mL ISOVUE-300 IOPAMIDOL (ISOVUE-300) INJECTION 61% COMPARISON:  None. FINDINGS: Lower chest: Lung bases are clear.  The heart size is normal. Hepatobiliary: No focal liver abnormality is seen. No gallstones, gallbladder wall thickening, or biliary dilatation. Pancreas: Unremarkable. No pancreatic ductal dilatation or surrounding inflammatory changes. Spleen: Normal in size without focal abnormality. Adrenals/Urinary Tract: Adrenal glands are unremarkable. Kidneys are normal, without renal calculi, focal lesion, or hydronephrosis. Bladder is unremarkable. Stomach/Bowel: Stomach is within normal limits.  Appendix not well seen but no right lower quadrant inflammation. No evidence of bowel wall thickening, distention, or inflammatory changes. Vascular/Lymphatic: Aortic atherosclerosis. No enlarged abdominal or pelvic lymph nodes. Reproductive: Uterus and bilateral adnexa are unremarkable. Other: No free air or free fluid. Musculoskeletal: 4.5 x 4.6 cm left presacral mass that appears to be associated with the left S1 foramen IMPRESSION: 1. No CT evidence for acute intra-abdominal or pelvic pathology. 2. 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 Nonemergent MRI follow-up recommended. Electronically Signed   By: Donavan Foil M.D.   On: 12/26/2016 19:35    Procedures Procedures (including critical care time)  Medications Ordered in ED Medications  iopamidol (ISOVUE-300) 61 % injection 100 mL (100 mLs Intravenous Contrast Given 12/26/16 1855)  famotidine (PEPCID) IVPB 20 mg premix (20 mg Intravenous  New Bag/Given 12/26/16 1919)  gi cocktail (Maalox,Lidocaine,Donnatal) (30 mLs Oral Given 12/26/16 1919)     Initial Impression / Assessment and Plan / ED Course  I have reviewed the triage vital signs and the nursing notes.  Pertinent labs & imaging results that were available during my care of the patient were reviewed by me and considered in my medical decision making (see chart for details).  8:26 PM On repeat exam the patient is awake and alert, in no distress, speaking clearly. We discussed all findings including CT scan with Finding of possible nerve sheath lesion. With otherwise reassuring findings, labs, CT scan, patient will follow-up with gastroenterology, be started on a course of PPI therapy. The patient may have nerve sheath lesion, anatomically this is inconsistent with where her pain is.   Final Clinical Impressions(s) / ED Diagnoses   Final diagnoses:  Epigastric pain    New Prescriptions New  Prescriptions   OMEPRAZOLE (PRILOSEC) 20 MG CAPSULE    Take 1 capsule (20 mg total) by mouth daily. Take one tablet daily     Carmin Muskrat, MD 12/26/16 2030

## 2016-12-26 NOTE — ED Triage Notes (Signed)
Pt states "I went to UC this morning and I took a hot shower this morning and I felt tunnel vision. I've had GI issues all week, abdominal pain about an hour after I eat, i've been eating bland food but i've also had diarrhea. Ive lost five pounds this week. They tried to get an IV but they weren't able to so they sent me here"

## 2016-12-26 NOTE — ED Triage Notes (Signed)
Abdominal Pain, Nausea with Diarrhea x 1 week

## 2016-12-26 NOTE — Discharge Instructions (Signed)
° °  Please let them know what your CBC here was normal but the CMP and Lipase were NOT sent off as it will take over 4 hours to come back.  You will be able to get faster results in the emergency department as they often start labs in triage (when you are waiting).

## 2016-12-27 ENCOUNTER — Telehealth (INDEPENDENT_AMBULATORY_CARE_PROVIDER_SITE_OTHER): Payer: 59

## 2016-12-27 DIAGNOSIS — R1013 Epigastric pain: Secondary | ICD-10-CM | POA: Diagnosis not present

## 2016-12-27 LAB — POCT CBC W AUTO DIFF (K'VILLE URGENT CARE)

## 2016-12-27 NOTE — Telephone Encounter (Signed)
Opened for CBC order

## 2016-12-28 ENCOUNTER — Telehealth (HOSPITAL_COMMUNITY): Payer: Self-pay | Admitting: *Deleted

## 2016-12-28 DIAGNOSIS — R1909 Other intra-abdominal and pelvic swelling, mass and lump: Secondary | ICD-10-CM

## 2016-12-28 NOTE — Telephone Encounter (Signed)
Per Dr Haroldine Laws pt needs lumbosacral MRI to evaluate s1 nerve root

## 2016-12-28 NOTE — Telephone Encounter (Signed)
Patient reports she is improving. She went to ER. Unintentional findings. She already has followup information.

## 2016-12-29 ENCOUNTER — Ambulatory Visit (HOSPITAL_COMMUNITY)
Admission: RE | Admit: 2016-12-29 | Discharge: 2016-12-29 | Disposition: A | Discharge status: Home / Self Care | Payer: 59 | Source: Ambulatory Visit | Attending: Internal Medicine | Admitting: Internal Medicine

## 2016-12-29 ENCOUNTER — Ambulatory Visit: Payer: 59 | Admitting: Licensed Clinical Social Worker

## 2016-12-29 DIAGNOSIS — R1909 Other intra-abdominal and pelvic swelling, mass and lump: Secondary | ICD-10-CM | POA: Insufficient documentation

## 2016-12-29 DIAGNOSIS — M47816 Spondylosis without myelopathy or radiculopathy, lumbar region: Secondary | ICD-10-CM | POA: Diagnosis not present

## 2016-12-29 MED ORDER — GADOBENATE DIMEGLUMINE 529 MG/ML IV SOLN
15.0000 mL | Freq: Once | INTRAVENOUS | Status: AC | PRN
  Administered 2016-12-29: 15 mL via INTRAVENOUS

## 2017-01-01 ENCOUNTER — Ambulatory Visit (HOSPITAL_COMMUNITY): Payer: 59

## 2017-01-05 DIAGNOSIS — I10 Essential (primary) hypertension: Secondary | ICD-10-CM | POA: Diagnosis not present

## 2017-01-05 DIAGNOSIS — D3610 Benign neoplasm of peripheral nerves and autonomic nervous system, unspecified: Secondary | ICD-10-CM | POA: Diagnosis not present

## 2017-01-05 DIAGNOSIS — Z6831 Body mass index (BMI) 31.0-31.9, adult: Secondary | ICD-10-CM | POA: Diagnosis not present

## 2017-01-19 ENCOUNTER — Ambulatory Visit: Payer: 59 | Admitting: Licensed Clinical Social Worker

## 2017-02-02 ENCOUNTER — Ambulatory Visit (INDEPENDENT_AMBULATORY_CARE_PROVIDER_SITE_OTHER): Payer: 59 | Admitting: Licensed Clinical Social Worker

## 2017-02-02 DIAGNOSIS — F419 Anxiety disorder, unspecified: Secondary | ICD-10-CM

## 2017-03-24 DIAGNOSIS — N6322 Unspecified lump in the left breast, upper inner quadrant: Secondary | ICD-10-CM | POA: Diagnosis not present

## 2017-04-01 DIAGNOSIS — N6489 Other specified disorders of breast: Secondary | ICD-10-CM | POA: Diagnosis not present

## 2017-04-28 ENCOUNTER — Other Ambulatory Visit (HOSPITAL_COMMUNITY): Payer: Self-pay | Admitting: Cardiology

## 2017-04-28 ENCOUNTER — Encounter: Payer: Self-pay | Admitting: Family Medicine

## 2017-04-28 ENCOUNTER — Ambulatory Visit (INDEPENDENT_AMBULATORY_CARE_PROVIDER_SITE_OTHER): Payer: 59 | Admitting: Family Medicine

## 2017-04-28 VITALS — BP 128/72 | HR 91 | Ht 60.0 in | Wt 158.0 lb

## 2017-04-28 DIAGNOSIS — R030 Elevated blood-pressure reading, without diagnosis of hypertension: Secondary | ICD-10-CM

## 2017-04-28 DIAGNOSIS — F411 Generalized anxiety disorder: Secondary | ICD-10-CM | POA: Diagnosis not present

## 2017-04-28 DIAGNOSIS — D3610 Benign neoplasm of peripheral nerves and autonomic nervous system, unspecified: Secondary | ICD-10-CM | POA: Diagnosis not present

## 2017-04-28 DIAGNOSIS — Z13220 Encounter for screening for lipoid disorders: Secondary | ICD-10-CM

## 2017-04-28 NOTE — Progress Notes (Signed)
   Subjective:    Patient ID: Candice Robertson, female    DOB: [birth year], 31 y.o.   MRN: [id number]  HPI 31 yo female here for f/u of anxiety.  She currently takes BuSpar but does not take it daily.  She only takes it occasionally if needed.  She does not need refills yet.  Overall she feels like her stress levels are okay.  She was seeing her therapist at behavioral health pretty regularly and that was extremely helpful.  Now she is just seeing her as needed.  She did want to give me an update.  She was actually seen in the emergency department over the summer back in July.  Initially at urgent care and then was transferred to the emergency department.  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  They felt like is most consistent with a schwannoma.  They are going to repeat her scan in 1 year.  Is following with Dr. Erline Levine.   Review of Systems     Objective:   Physical Exam  Constitutional: She is oriented to person, place, and time. She appears well-developed and well-nourished.  HENT:  Head: Normocephalic and atraumatic.  Cardiovascular: Normal rate, regular rhythm and normal heart sounds.  Pulmonary/Chest: Effort normal and breath sounds normal.  Neurological: She is alert and oriented to person, place, and time.  Skin: Skin is warm and dry.  Psychiatric: She has a normal mood and affect. Her behavior is normal.          Assessment & Plan:  Anxiety -PHQ 9 score of 0 and gad 7 score of 4 today.  Symptoms overall well controlled.  She is okay which is taking BuSpar as needed.  Okay to refill medication when needed.  Otherwise follow-up in 1 year.  Continue to use counseling as needed. Elevated BP - repeat BP is at goal.    Schwannoma-being followed by Dr. Erline Levine.  Added to care team.  At this point they are monitoring yearly.  Updated flu vaccine in the chart.

## 2017-04-29 ENCOUNTER — Ambulatory Visit (HOSPITAL_COMMUNITY)
Admission: RE | Admit: 2017-04-29 | Discharge: 2017-04-29 | Disposition: A | Discharge status: Home / Self Care | Payer: 59 | Source: Ambulatory Visit | Attending: Internal Medicine | Admitting: Internal Medicine

## 2017-04-29 DIAGNOSIS — D3610 Benign neoplasm of peripheral nerves and autonomic nervous system, unspecified: Secondary | ICD-10-CM | POA: Insufficient documentation

## 2017-04-29 DIAGNOSIS — Z13220 Encounter for screening for lipoid disorders: Secondary | ICD-10-CM | POA: Insufficient documentation

## 2017-04-29 LAB — LIPID PANEL
CHOLESTEROL: 184 mg/dL (ref 0–200)
HDL: 62 mg/dL (ref >40)
LDL Cholesterol: 110 mg/dL — ABNORMAL HIGH (ref 0–99)
Total CHOL/HDL Ratio: 3 RATIO
Triglycerides: 60 mg/dL (ref <150)
VLDL: 12 mg/dL (ref 0–40)

## 2017-04-30 LAB — LDL CHOLESTEROL, DIRECT: LDL DIRECT: 107 mg/dL — AB (ref 0–99)

## 2017-06-14 DIAGNOSIS — Z01419 Encounter for gynecological examination (general) (routine) without abnormal findings: Secondary | ICD-10-CM | POA: Diagnosis not present

## 2017-06-16 ENCOUNTER — Other Ambulatory Visit: Payer: Self-pay | Admitting: Neurosurgery

## 2017-06-16 DIAGNOSIS — D3610 Benign neoplasm of peripheral nerves and autonomic nervous system, unspecified: Secondary | ICD-10-CM

## 2017-06-23 ENCOUNTER — Telehealth: Payer: Self-pay | Admitting: Family Medicine

## 2017-06-23 MED ORDER — ERYTHROMYCIN 5 MG/GM OP OINT: 1.0000 "application " | TOPICAL_OINTMENT | Freq: Three times a day (TID) | OPHTHALMIC | 0 refills | Status: AC

## 2017-06-23 MED FILL — ERYTHROMYCIN EYE OINTMENT: 5 | 10 days supply | Qty: 4 | Fill #0

## 2017-06-23 NOTE — Telephone Encounter (Signed)
Hey Dr. [name]! I apologize for reaching out to you through email.  My Deloris Ping is not synced with you and therefor was unable to send you a message through there, and there were no doctors available on [place] chat during my lunch break.  I've had pink eye for two days after staying at a hotel [facility]) and now the other eye is starting to itch and get goopy along the tear duct.  Do I need to make an appointment or are you able to send in eye drops to my pharmacy without being seen?  I have missed a lot of work lately between my girls having the flu and recent snow and holidays and don't really have the PAL to miss any more work! I again apologize for reaching out to you this way and will not do it again!  Naylin Burkle (Feb 04, 1986)    Vilinda Blanks   RN, BSN [facility]

## 2017-06-23 NOTE — Telephone Encounter (Signed)
rx sent for erythromycin op.

## 2017-07-08 ENCOUNTER — Other Ambulatory Visit: Payer: Self-pay

## 2017-07-08 ENCOUNTER — Emergency Department (INDEPENDENT_AMBULATORY_CARE_PROVIDER_SITE_OTHER)
Admission: EM | Admit: 2017-07-08 | Discharge: 2017-07-08 | Disposition: A | Discharge status: Home / Self Care | Payer: 59 | Source: Home / Self Care | Attending: Family Medicine | Admitting: Family Medicine

## 2017-07-08 DIAGNOSIS — J069 Acute upper respiratory infection, unspecified: Secondary | ICD-10-CM

## 2017-07-08 DIAGNOSIS — B9789 Other viral agents as the cause of diseases classified elsewhere: Secondary | ICD-10-CM | POA: Diagnosis not present

## 2017-07-08 NOTE — Discharge Instructions (Signed)
Take plain guaifenesin (1200mg  extended release tabs such as Mucinex) twice daily, with plenty of water, for cough and congestion.  May add Pseudoephedrine (30mg , one or two every 4 to 6 hours) for sinus congestion.  Get adequate rest.   May use Afrin nasal spray (or generic oxymetazoline) each morning for about 5 days and then discontinue.  Also recommend using saline nasal spray several times daily and saline nasal irrigation (AYR is a common brand).  Use Flonase nasal spray each morning after using Afrin nasal spray and saline nasal irrigation. Try warm salt water gargles for sore throat.  Stop all antihistamines for now, and other non-prescription cough/cold preparations. May take Ibuprofen 200mg , 4 tabs every 8 hours with food for body aches, headache, chest/sternum discomfort, etc. May take Delsym Cough Suppressant at bedtime for nighttime cough.

## 2017-07-08 NOTE — ED Triage Notes (Signed)
Pt stated that it started with cold SX on Tuesday, Clear drainage, nasal congeation.  Yesterday had sneezing all day.  Today more nasal congestion, pressure in the head, and feels SOB.  O2 sat is 98%.

## 2017-07-08 NOTE — ED Provider Notes (Signed)
[name]    CSN: [id number] Arrival date & time: 07/08/17  1002     History   Chief Complaint Chief Complaint  Patient presents with  . Shortness of Breath  . Fatigue  . Nasal Congestion    HPI Candice Robertson is a 32 y.o. female.   Two days ago patient developed runny nose, followed by sneezing yesterday.  She has been fatigued, and has noted her heart "racing" without chest pain or fever/chills.  She also has a mild headache.  No sore throat or cough.   The history is provided by the patient.    Past Medical History:  Diagnosis Date  . Anxiety   . Anxiety disorder   . Breech presentation   . Dizziness   . GERD (gastroesophageal reflux disease)   . History of chicken pox   . History of gestational hypertension 10/14/2011  . Hypertension    With pregnancy only  . PP care - s/p 1C/S - breech 5/1 10/14/2011  . Tachycardia    with 2016 pregancy only    Patient Active Problem List   Diagnosis Date Noted  . Schwannoma 04/29/2017  . GAD (generalized anxiety disorder) 08/19/2015  . Mild preeclampsia 03/07/2015  . Iron deficiency anemia of pregnancy 03/07/2015  . Acute blood loss anemia 03/07/2015  . Autonomic dysfunction 12/13/2014  . Near syncope 12/13/2014  . History of gestational hypertension 10/14/2011  . DELAYED MENSES 07/17/2010  . PALPITATIONS, OCCASIONAL 07/14/2010  . CERVICALGIA 01/09/2010    Past Surgical History:  Procedure Laterality Date  . CESAREAN SECTION  10/14/2011   Procedure: CESAREAN SECTION;  Surgeon: Lovenia Kim, MD;  Location: [facility];  Service: Gynecology;  Laterality: N/A;  Primary EDD: 10/21/11  . CESAREAN SECTION N/A 03/05/2015   Procedure: Repeat CESAREAN SECTION;  Surgeon: Brien Few, MD;  Location: [facility];  Service: Obstetrics;  Laterality: N/A;  EDD: 03/26/15   . TONSILECTOMY/ADENOIDECTOMY WITH MYRINGOTOMY  1991  . TONSILLECTOMY    . TYMPANOSTOMY TUBE PLACEMENT  1989    OB History    Gravida Para Term  Preterm AB Living   2 2 2     1    SAB TAB Ectopic Multiple Live Births         0 1       Home Medications    Prior to Admission medications   Medication Sig Start Date End Date Taking? Authorizing Provider  busPIRone (BUSPAR) 5 MG tablet Take 0.5 tablets (2.5 mg total) by mouth 2 (two) times daily as needed (anxiety). 07/31/16   [name], MD  Ibuprofen 200 MG CAPS Take as needed by mouth.    [provider]  omeprazole (PRILOSEC) 20 MG capsule Take 1 capsule (20 mg total) by mouth daily. Take one tablet daily Patient taking differently: Take 20 mg as needed by mouth. Take one tablet daily 12/26/16   Carmin Muskrat, MD    Family History Family History  Problem Relation Age of Onset  . Alcohol abuse Maternal Uncle   . Diabetes Maternal Uncle   . Heart attack Maternal Grandmother   . Depression Maternal Grandmother     Social History Social History   Tobacco Use  . Smoking status: Former Smoker    Last attempt to quit: 10/07/2009    Years since quitting: 7.7  . Smokeless tobacco: Never Used  Substance Use Topics  . Alcohol use: Yes    Comment: socially but none with pregnancy  . Drug use: No  Allergies   Codeine phosphate and Zoloft [sertraline hcl]   Review of Systems Review of Systems No sore throat No cough + sneezing No pleuritic pain No wheezing No nasal congestion + post-nasal drainage No sinus pain/pressure No itchy/red eyes No earache No hemoptysis No SOB No fever/chills No nausea No vomiting No abdominal pain No diarrhea No urinary symptoms No skin rash + fatigue No myalgias + headache Used OTC meds without relief   Physical Exam Triage Vital Signs ED Triage Vitals  Enc Vitals Group     BP 07/08/17 1023 (!) 146/99     Pulse Rate 07/08/17 1023 (!) 104     Resp --      Temp 07/08/17 1023 98.3 F (36.8 C)     Temp Source 07/08/17 1023 Oral     SpO2 07/08/17 1023 98 %     Weight 07/08/17 1024 159 lb (72.1 kg)      Height 07/08/17 1024 5' (1.524 m)     Head Circumference --      Peak Flow --      Pain Score 07/08/17 1024 0     Pain Loc --      Pain Edu? --      Excl. in [place]? --    No data found.  Updated Vital Signs BP (!) 146/99 (BP Location: Right Arm)   Pulse (!) 104   Temp 98.3 F (36.8 C) (Oral)   Ht 5' (1.524 m)   Wt 159 lb (72.1 kg)   SpO2 98%   BMI 31.05 kg/m   Visual Acuity Right Eye Distance:   Left Eye Distance:   Bilateral Distance:    Right Eye Near:   Left Eye Near:    Bilateral Near:     Physical Exam Nursing notes and Vital Signs reviewed. Appearance:  Patient appears stated age, and in no acute distress Eyes:  Pupils are equal, round, and reactive to light and accomodation.  Extraocular movement is intact.  Conjunctivae are not inflamed  Ears:  Canals normal.  Tympanic membranes normal.  Nose:  Mildly congested turbinates.  No sinus tenderness.  Pharynx:  Normal Neck:  Supple.  Enlarged posterior/lateral nodes are palpated bilaterally, tender to palpation on the left.   Lungs:  Clear to auscultation.  Breath sounds are equal.  Moving air well. Heart:  Regular rate and rhythm without murmurs, rubs, or gallops.  Abdomen:  Nontender without masses or hepatosplenomegaly.  Bowel sounds are present.  No CVA or flank tenderness.  Extremities:  No edema.  Skin:  No rash present.    UC Treatments / Results  Labs (all labs ordered are listed, but only abnormal results are displayed) Labs Reviewed - No data to display  EKG  EKG Interpretation None       Radiology No results found.  Procedures Procedures (including critical care time)  Medications Ordered in UC Medications - No data to display   Initial Impression / Assessment and Plan / UC Course  I have reviewed the triage vital signs and the nursing notes.  Pertinent labs & imaging results that were available during my care of the patient were reviewed by me and considered in my medical decision  making (see chart for details).    There is no evidence of bacterial infection today.   Treat symptomatically for now  Take plain guaifenesin (1200mg  extended release tabs such as Mucinex) twice daily, with plenty of water, for cough and congestion.  May add Pseudoephedrine (30mg , one or  two every 4 to 6 hours) for sinus congestion.  Get adequate rest.   May use Afrin nasal spray (or generic oxymetazoline) each morning for about 5 days and then discontinue.  Also recommend using saline nasal spray several times daily and saline nasal irrigation (AYR is a common brand).  Use Flonase nasal spray each morning after using Afrin nasal spray and saline nasal irrigation. Try warm salt water gargles for sore throat.  Stop all antihistamines for now, and other non-prescription cough/cold preparations. May take Ibuprofen 200mg , 4 tabs every 8 hours with food for body aches, headache, chest/sternum discomfort, etc. May take Delsym Cough Suppressant at bedtime for nighttime cough.  Followup with Family Doctor if not improved in one week.     Final Clinical Impressions(s) / UC Diagnoses   Final diagnoses:  Viral URI with cough    ED Discharge Orders    None           Kandra Nicolas, MD 07/11/17 1233

## 2017-07-12 ENCOUNTER — Ambulatory Visit (INDEPENDENT_AMBULATORY_CARE_PROVIDER_SITE_OTHER): Payer: 59

## 2017-07-12 DIAGNOSIS — D3616 Benign neoplasm of peripheral nerves and autonomic nervous system of pelvis: Secondary | ICD-10-CM

## 2017-07-12 DIAGNOSIS — D3610 Benign neoplasm of peripheral nerves and autonomic nervous system, unspecified: Secondary | ICD-10-CM | POA: Diagnosis not present

## 2017-07-12 MED ORDER — GADOBENATE DIMEGLUMINE 529 MG/ML IV SOLN
15.0000 mL | Freq: Once | INTRAVENOUS | Status: AC | PRN
  Administered 2017-07-12: 14 mL via INTRAVENOUS

## 2017-07-29 DIAGNOSIS — H52223 Regular astigmatism, bilateral: Secondary | ICD-10-CM | POA: Diagnosis not present

## 2017-08-16 NOTE — Progress Notes (Signed)
[name] [address], [place] [address] Phone: [phone] Subjective:    I'm seeing this patient by the request  of:  [name], MD   CC:  Bilateral shoulder and neck pain   OFB:PZWCHENIDP  Candice Robertson is a 32 y.o. female coming in with complaint of bilateral shoulder pain. Behind the shoulder blades. Says it feels like something needs to "pop" or "stretch". Worse on the left. When she eats the pain increases. She says it feels like something is scratching that area.  Onset- 2 weeks  Character- Achy, "like a tooth ache" Aggravating factors-  Reliving factors- Heating pad, Ibuprofen Severity-7 out of 10   Hx of Scwannoma in lower back followed by nuerosurgery.   Past Medical History:  Diagnosis Date  . Anxiety   . Anxiety disorder   . Breech presentation   . Dizziness   . GERD (gastroesophageal reflux disease)   . History of chicken pox   . History of gestational hypertension 10/14/2011  . Hypertension    With pregnancy only  . PP care - s/p 1C/S - breech 5/1 10/14/2011  . Tachycardia    with 2016 pregancy only   Past Surgical History:  Procedure Laterality Date  . CESAREAN SECTION  10/14/2011   Procedure: CESAREAN SECTION;  Surgeon: Lovenia Kim, MD;  Location: [facility];  Service: Gynecology;  Laterality: N/A;  Primary EDD: 10/21/11  . CESAREAN SECTION N/A 03/05/2015   Procedure: Repeat CESAREAN SECTION;  Surgeon: Brien Few, MD;  Location: [facility];  Service: Obstetrics;  Laterality: N/A;  EDD: 03/26/15   . TONSILECTOMY/ADENOIDECTOMY WITH MYRINGOTOMY  1991  . TONSILLECTOMY    . TYMPANOSTOMY TUBE PLACEMENT  1989   Social History   Socioeconomic History  . Marital status: Married    Spouse name: None  . Number of children: None  . Years of education: None  . Highest education level: None  Social Needs  . Financial resource strain: None  . Food insecurity - worry: None  . Food insecurity - inability: None    . Transportation needs - medical: None  . Transportation needs - non-medical: None  Occupational History  . None  Tobacco Use  . Smoking status: Former Smoker    Last attempt to quit: 10/07/2009    Years since quitting: 7.8  . Smokeless tobacco: Never Used  Substance and Sexual Activity  . Alcohol use: Yes    Comment: socially but none with pregnancy  . Drug use: No  . Sexual activity: None  Other Topics Concern  . None  Social History Narrative   1 cup of coffee in the morning. No other caffeine. No regular exercise.   Allergies  Allergen Reactions  . Codeine Phosphate     REACTION: nausea, vomiting, severe migraines and neck pain  . Zoloft [Sertraline Hcl] Other (See Comments)    Delusions.  Skin crawling sensation.    Family History  Problem Relation Age of Onset  . Alcohol abuse Maternal Uncle   . Diabetes Maternal Uncle   . Heart attack Maternal Grandmother   . Depression Maternal Grandmother      Past medical history, social, surgical and family history all reviewed in electronic medical record.  No pertanent information unless stated regarding to the chief complaint.   Review of Systems:Review of systems updated and as accurate as of 08/17/17  No headache, visual changes, nausea, vomiting, diarrhea, constipation, dizziness, abdominal pain, skin rash, fevers, chills, night  sweats, weight loss, swollen lymph nodes, body aches, joint swelling, chest pain, shortness of breath, mood changes.  Positive muscle aches  Objective  Blood pressure 140/76, pulse 89, height 5' (1.524 m), weight 159 lb (72.1 kg), SpO2 98 %, currently breastfeeding. Systems examined below as of 08/17/17   General: No apparent distress alert and oriented x3 mood and affect normal, dressed appropriately.  HEENT: Pupils equal, extraocular movements intact  Respiratory: Patient's speak in full sentences and does not appear short of breath  Cardiovascular: No lower extremity edema, non tender, no  erythema  Skin: Warm dry intact with no signs of infection or rash on extremities or on axial skeleton.  Abdomen: Soft nontender  Neuro: Cranial nerves II through XII are intact, neurovascularly intact in all extremities with 2+ DTRs and 2+ pulses.  Lymph: No lymphadenopathy of posterior or anterior cervical chain or axillae bilaterally.  Gait normal with good balance and coordination.  MSK:  Non tender with full range of motion and good stability and symmetric strength and tone of shoulders, elbows, wrist, hip, knee and ankles bilaterally.  Back Exam:  Inspection: Unremarkable  Motion: Flexion 45 deg, Extension 20 deg, Side Bending to 35 deg bilaterally,  Rotation to 35 deg bilaterally  SLR laying: Negative  XSLR laying: Negative  Palpable tenderness: Pain more in the thoracolumbar juncture right greater than left.Marland Kitchen FABER: negative. Sensory change: Gross sensation intact to all lumbar and sacral dermatomes.  Reflexes: 2+ at both patellar tendons, 2+ at achilles tendons, Babinski's downgoing.  Strength at foot  Plantar-flexion: 5/5 Dorsi-flexion: 5/5 Eversion: 5/5 Inversion: 5/5  Leg strength  Quad: 5/5 Hamstring: 5/5 Hip flexor: 5/5 Hip abductors: 5/5  Gait unremarkable. Mild scapular dyskinesis on the right side.  Osteopathic findings C2 flexed rotated and side bent right C7 flexed rotated and side bent left T3 extended rotated and side bent left inhaled third rib T9 extended rotated and side bent left L2 flexed rotated and side bent right Sacrum right on right     Impression and Recommendations:     This case required medical decision making of moderate complexity.      Note: This dictation was prepared with Dragon dictation along with smaller phrase technology. Any transcriptional errors that result from this process are unintentional.

## 2017-08-17 ENCOUNTER — Encounter: Payer: Self-pay | Admitting: Family Medicine

## 2017-08-17 ENCOUNTER — Ambulatory Visit (INDEPENDENT_AMBULATORY_CARE_PROVIDER_SITE_OTHER): Payer: 59 | Admitting: Family Medicine

## 2017-08-17 DIAGNOSIS — M999 Biomechanical lesion, unspecified: Secondary | ICD-10-CM | POA: Insufficient documentation

## 2017-08-17 DIAGNOSIS — G2589 Other specified extrapyramidal and movement disorders: Secondary | ICD-10-CM | POA: Insufficient documentation

## 2017-08-17 NOTE — Assessment & Plan Note (Signed)
Scapular dyskinesis noted.  Discussed icing regimen and home exercises.  Posture and ergonomics throughout the day.  Discussed icing regimen.  Spine well to manipulation.  Follow-up to 4-6 weeks

## 2017-08-17 NOTE — Assessment & Plan Note (Signed)
Decision today to treat with OMT was based on Physical Exam  After verbal consent patient was treated with HVLA, ME, FPR techniques in cervical, thoracic, rib, lumbar and sacral areas  Patient tolerated the procedure well with improvement in symptoms  Patient given exercises, stretches and lifestyle modifications  See medications in patient instructions if given  Patient will follow up in 4 weeks 

## 2017-08-17 NOTE — Patient Instructions (Addendum)
Good to see you  Ice 20 minutes 2 times daily. Usually after activity and before bed. Exercises 3 times a week.  Keep hands within peripheral vision  Try to get monitor at eye level.  Possible tennis ball duct tape to chair between shoulder blades  On wall with heels, butt shoulder and head touching for a goal of 5 minutes daily  Vitamin D 2000 IU daily  When on menstruation consider iron 65mg  daily with 500mg  of vitamin C See me again in 3-4 weeks

## 2017-09-06 DIAGNOSIS — I10 Essential (primary) hypertension: Secondary | ICD-10-CM | POA: Diagnosis not present

## 2017-09-06 DIAGNOSIS — D3610 Benign neoplasm of peripheral nerves and autonomic nervous system, unspecified: Secondary | ICD-10-CM | POA: Diagnosis not present

## 2017-09-06 DIAGNOSIS — Z6831 Body mass index (BMI) 31.0-31.9, adult: Secondary | ICD-10-CM | POA: Diagnosis not present

## 2017-09-14 NOTE — Progress Notes (Signed)
[name] [address] [place], [place] [address] Phone: [phone] Subjective:    I'm seeing this patient by the request  of:    CC: Back pain follow-up  [id number]  Candice Robertson is a 31 y.o. female coming in with complaint of back pain. She is still having a little bit of pain in between the scapula but her pain has improved.  Patient states that she has noticed that she can make it throughout the day a lot longer.  Taking the iron supplementation has helped out significantly where she has not had as many headaches and is feeling better in the mornings.  Denies any side effects to any of the vitamins.  Feels that the exercises have improved.      Past Medical History:  Diagnosis Date  . Anxiety   . Anxiety disorder   . Breech presentation   . Dizziness   . GERD (gastroesophageal reflux disease)   . History of chicken pox   . History of gestational hypertension 10/14/2011  . Hypertension    With pregnancy only  . PP care - s/p 1C/S - breech 5/1 10/14/2011  . Tachycardia    with 2016 pregancy only   Past Surgical History:  Procedure Laterality Date  . CESAREAN SECTION  10/14/2011   Procedure: CESAREAN SECTION;  Surgeon: Lovenia Kim, MD;  Location: [facility];  Service: Gynecology;  Laterality: N/A;  Primary EDD: 10/21/11  . CESAREAN SECTION N/A 03/05/2015   Procedure: Repeat CESAREAN SECTION;  Surgeon: Brien Few, MD;  Location: [facility];  Service: Obstetrics;  Laterality: N/A;  EDD: 03/26/15   . TONSILECTOMY/ADENOIDECTOMY WITH MYRINGOTOMY  1991  . TONSILLECTOMY    . TYMPANOSTOMY TUBE PLACEMENT  1989   Social History   Socioeconomic History  . Marital status: Married    Spouse name: Not on file  . Number of children: Not on file  . Years of education: Not on file  . Highest education level: Not on file  Occupational History  . Not on file  Social Needs  . Financial resource strain: Not on file  . Food insecurity:    Worry: Not  on file    Inability: Not on file  . Transportation needs:    Medical: Not on file    Non-medical: Not on file  Tobacco Use  . Smoking status: Former Smoker    Last attempt to quit: 10/07/2009    Years since quitting: 7.9  . Smokeless tobacco: Never Used  Substance and Sexual Activity  . Alcohol use: Yes    Comment: socially but none with pregnancy  . Drug use: No  . Sexual activity: Not on file  Lifestyle  . Physical activity:    Days per week: Not on file    Minutes per session: Not on file  . Stress: Not on file  Relationships  . Social connections:    Talks on phone: Not on file    Gets together: Not on file    Attends religious service: Not on file    Active member of club or organization: Not on file    Attends meetings of clubs or organizations: Not on file    Relationship status: Not on file  Other Topics Concern  . Not on file  Social History Narrative   1 cup of coffee in the morning. No other caffeine. No regular exercise.   Allergies  Allergen Reactions  . Codeine Phosphate     REACTION:  nausea, vomiting, severe migraines and neck pain  . Zoloft [Sertraline Hcl] Other (See Comments)    Delusions.  Skin crawling sensation.    Family History  Problem Relation Age of Onset  . Alcohol abuse Maternal Uncle   . Diabetes Maternal Uncle   . Heart attack Maternal Grandmother   . Depression Maternal Grandmother      Past medical history, social, surgical and family history all reviewed in electronic medical record.  No pertanent information unless stated regarding to the chief complaint.   Review of Systems:Review of systems updated and as accurate as of 09/15/17  No headache, visual changes, nausea, vomiting, diarrhea, constipation, dizziness, abdominal pain, skin rash, fevers, chills, night sweats, weight loss, swollen lymph nodes, body aches, joint swelling, muscle aches, chest pain, shortness of breath, mood changes.  Mild positive muscle aches  Objective    Blood pressure 140/82, pulse 87, weight 159 lb (72.1 kg), SpO2 99 %, currently breastfeeding. Systems examined below as of 09/15/17   General: No apparent distress alert and oriented x3 mood and affect normal, dressed appropriately.  HEENT: Pupils equal, extraocular movements intact  Respiratory: Patient's speak in full sentences and does not appear short of breath  Cardiovascular: No lower extremity edema, non tender, no erythema  Skin: Warm dry intact with no signs of infection or rash on extremities or on axial skeleton.  Abdomen: Soft nontender  Neuro: Cranial nerves II through XII are intact, neurovascularly intact in all extremities with 2+ DTRs and 2+ pulses.  Lymph: No lymphadenopathy of posterior or anterior cervical chain or axillae bilaterally.  Gait normal with good balance and coordination.  MSK:  Non tender with full range of motion and good stability and symmetric strength and tone of shoulders, elbows, wrist, hip, knee and ankles bilaterally.  Patient back exam still shows the patient does have some mild scapular dyskinesia on the right side.  Patient does have tenderness still in the thoracolumbar juncture as well as around the periscapular region more right than left.  Near full range of motion of the neck.  Negative straight leg test of the lower back.  Neurovascularly intact in all extremities.  Osteopathic findings  C6 flexed rotated and side bent right  T3 extended rotated and side bent right inhaled third rib T9 extended rotated and side bent left L3 flexed rotated and side bent right Sacrum right on right     Impression and Recommendations:     This case required medical decision making of moderate complexity.      Note: This dictation was prepared with Dragon dictation along with smaller phrase technology. Any transcriptional errors that result from this process are unintentional.

## 2017-09-15 ENCOUNTER — Ambulatory Visit (INDEPENDENT_AMBULATORY_CARE_PROVIDER_SITE_OTHER): Payer: 59 | Admitting: Family Medicine

## 2017-09-15 ENCOUNTER — Encounter: Payer: Self-pay | Admitting: Family Medicine

## 2017-09-15 VITALS — BP 140/82 | HR 87 | Wt 159.0 lb

## 2017-09-15 DIAGNOSIS — G2589 Other specified extrapyramidal and movement disorders: Secondary | ICD-10-CM

## 2017-09-15 DIAGNOSIS — M999 Biomechanical lesion, unspecified: Secondary | ICD-10-CM | POA: Diagnosis not present

## 2017-09-15 NOTE — Assessment & Plan Note (Signed)
Improvement noted.  We discussed posture, ergonomics, responding well to osteopathic manipulation.  Discussed which activities to do which wants to avoid.  History of a shwannoma and will keep this in mind.  Do not feel that further imaging is necessary at this time.  Patient will follow up with me again in 4-6 weeks

## 2017-09-15 NOTE — Assessment & Plan Note (Addendum)
Decision today to treat with OMT was based on Physical Exam  After verbal consent patient was treated with HVLA, ME, FPR techniques in cervical, thoracic, rib, lumbar and sacral areas  Patient tolerated the procedure well with improvement in symptoms  Patient given exercises, stretches and lifestyle modifications  See medications in patient instructions if given  Patient will follow up in 4-6 weeks 

## 2017-09-15 NOTE — Patient Instructions (Signed)
Great to see you  I am proud of you  Keep it up! COntinue the vitamins Spot check your blood pressure maybe 2 times a week  When getting labs with primary have her check TSH, T3 T4, ferritin, vitamin D and maybe A1C Keep up with the posture See me again in 5-6 weeks!

## 2017-09-30 DIAGNOSIS — R928 Other abnormal and inconclusive findings on diagnostic imaging of breast: Secondary | ICD-10-CM | POA: Diagnosis not present

## 2017-10-18 ENCOUNTER — Ambulatory Visit (INDEPENDENT_AMBULATORY_CARE_PROVIDER_SITE_OTHER): Payer: 59 | Admitting: Family Medicine

## 2017-10-18 ENCOUNTER — Encounter: Payer: Self-pay | Admitting: Family Medicine

## 2017-10-18 VITALS — BP 145/85 | HR 120 | Ht 60.0 in | Wt 159.0 lb

## 2017-10-18 DIAGNOSIS — R0683 Snoring: Secondary | ICD-10-CM | POA: Diagnosis not present

## 2017-10-18 DIAGNOSIS — F411 Generalized anxiety disorder: Secondary | ICD-10-CM

## 2017-10-18 DIAGNOSIS — R030 Elevated blood-pressure reading, without diagnosis of hypertension: Secondary | ICD-10-CM | POA: Diagnosis not present

## 2017-10-18 MED ORDER — BUSPIRONE HCL 5 MG PO TABS: 2.5000 mg | ORAL_TABLET | Freq: Two times a day (BID) | ORAL | 99 refills | Status: DC | PRN

## 2017-10-18 MED FILL — busPIRone HCL 5 MG TABS: 5 | 30 days supply | Qty: 30 | Fill #0

## 2017-10-18 NOTE — Patient Instructions (Signed)
DASH Eating Plan DASH stands for "Dietary Approaches to Stop Hypertension." The DASH eating plan is a healthy eating plan that has been shown to reduce high blood pressure (hypertension). It may also reduce your risk for type 2 diabetes, heart disease, and stroke. The DASH eating plan may also help with weight loss. What are tips for following this plan? General guidelines  Avoid eating more than 2,300 mg (milligrams) of salt (sodium) a day. If you have hypertension, you may need to reduce your sodium intake to 1,500 mg a day.  Limit alcohol intake to no more than 1 drink a day for nonpregnant women and 2 drinks a day for men. One drink equals 12 oz of beer, 5 oz of wine, or 1 oz of hard liquor.  Work with your health care provider to maintain a healthy body weight or to lose weight. Ask what an ideal weight is for you.  Get at least 30 minutes of exercise that causes your heart to beat faster (aerobic exercise) most days of the week. Activities may include walking, swimming, or biking.  Work with your health care provider or diet and nutrition specialist (dietitian) to adjust your eating plan to your individual calorie needs. Reading food labels  Check food labels for the amount of sodium per serving. Choose foods with less than 5 percent of the Daily Value of sodium. Generally, foods with less than 300 mg of sodium per serving fit into this eating plan.  To find whole grains, look for the word "whole" as the first word in the ingredient list. Shopping  Buy products labeled as "low-sodium" or "no salt added."  Buy fresh foods. Avoid canned foods and premade or frozen meals. Cooking  Avoid adding salt when cooking. Use salt-free seasonings or herbs instead of table salt or sea salt. Check with your health care provider or pharmacist before using salt substitutes.  Do not fry foods. Cook foods using healthy methods such as baking, boiling, grilling, and broiling instead.  Cook with  heart-healthy oils, such as olive, canola, soybean, or sunflower oil. Meal planning   Eat a balanced diet that includes: ? 5 or more servings of fruits and vegetables each day. At each meal, try to fill half of your plate with fruits and vegetables. ? Up to 6-8 servings of whole grains each day. ? Less than 6 oz of lean meat, poultry, or fish each day. A 3-oz serving of meat is about the same size as a deck of cards. One egg equals 1 oz. ? 2 servings of low-fat dairy each day. ? A serving of nuts, seeds, or beans 5 times each week. ? Heart-healthy fats. Healthy fats called Omega-3 fatty acids are found in foods such as flaxseeds and coldwater fish, like sardines, salmon, and mackerel.  Limit how much you eat of the following: ? Canned or prepackaged foods. ? Food that is high in trans fat, such as fried foods. ? Food that is high in saturated fat, such as fatty meat. ? Sweets, desserts, sugary drinks, and other foods with added sugar. ? Full-fat dairy products.  Do not salt foods before eating.  Try to eat at least 2 vegetarian meals each week.  Eat more home-cooked food and less restaurant, buffet, and fast food.  When eating at a restaurant, ask that your food be prepared with less salt or no salt, if possible. What foods are recommended? The items listed may not be a complete list. Talk with your dietitian about what   dietary choices are best for you. Grains Whole-grain or whole-wheat bread. Whole-grain or whole-wheat pasta. Brown rice. Oatmeal. Quinoa. Bulgur. Whole-grain and low-sodium cereals. Pita bread. Low-fat, low-sodium crackers. Whole-wheat flour tortillas. Vegetables Fresh or frozen vegetables (raw, steamed, roasted, or grilled). Low-sodium or reduced-sodium tomato and vegetable juice. Low-sodium or reduced-sodium tomato sauce and tomato paste. Low-sodium or reduced-sodium canned vegetables. Fruits All fresh, dried, or frozen fruit. Canned fruit in natural juice (without  added sugar). Meat and other protein foods Skinless chicken or turkey. Ground chicken or turkey. Pork with fat trimmed off. Fish and seafood. Egg whites. Dried beans, peas, or lentils. Unsalted nuts, nut butters, and seeds. Unsalted canned beans. Lean cuts of beef with fat trimmed off. Low-sodium, lean deli meat. Dairy Low-fat (1%) or fat-free (skim) milk. Fat-free, low-fat, or reduced-fat cheeses. Nonfat, low-sodium ricotta or cottage cheese. Low-fat or nonfat yogurt. Low-fat, low-sodium cheese. Fats and oils Soft margarine without trans fats. Vegetable oil. Low-fat, reduced-fat, or light mayonnaise and salad dressings (reduced-sodium). Canola, safflower, olive, soybean, and sunflower oils. Avocado. Seasoning and other foods Herbs. Spices. Seasoning mixes without salt. Unsalted popcorn and pretzels. Fat-free sweets. What foods are not recommended? The items listed may not be a complete list. Talk with your dietitian about what dietary choices are best for you. Grains Baked goods made with fat, such as croissants, muffins, or some breads. Dry pasta or rice meal packs. Vegetables Creamed or fried vegetables. Vegetables in a cheese sauce. Regular canned vegetables (not low-sodium or reduced-sodium). Regular canned tomato sauce and paste (not low-sodium or reduced-sodium). Regular tomato and vegetable juice (not low-sodium or reduced-sodium). Pickles. Olives. Fruits Canned fruit in a light or heavy syrup. Fried fruit. Fruit in cream or butter sauce. Meat and other protein foods Fatty cuts of meat. Ribs. Fried meat. Bacon. Sausage. Bologna and other processed lunch meats. Salami. Fatback. Hotdogs. Bratwurst. Salted nuts and seeds. Canned beans with added salt. Canned or smoked fish. Whole eggs or egg yolks. Chicken or turkey with skin. Dairy Whole or 2% milk, cream, and half-and-half. Whole or full-fat cream cheese. Whole-fat or sweetened yogurt. Full-fat cheese. Nondairy creamers. Whipped toppings.  Processed cheese and cheese spreads. Fats and oils Butter. Stick margarine. Lard. Shortening. Ghee. Bacon fat. Tropical oils, such as coconut, palm kernel, or palm oil. Seasoning and other foods Salted popcorn and pretzels. Onion salt, garlic salt, seasoned salt, table salt, and sea salt. Worcestershire sauce. Tartar sauce. Barbecue sauce. Teriyaki sauce. Soy sauce, including reduced-sodium. Steak sauce. Canned and packaged gravies. Fish sauce. Oyster sauce. Cocktail sauce. Horseradish that you find on the shelf. Ketchup. Mustard. Meat flavorings and tenderizers. Bouillon cubes. Hot sauce and Tabasco sauce. Premade or packaged marinades. Premade or packaged taco seasonings. Relishes. Regular salad dressings. Where to find more information:  National Heart, Lung, and Blood Institute: www.nhlbi.nih.gov  American Heart Association: www.heart.org Summary  The DASH eating plan is a healthy eating plan that has been shown to reduce high blood pressure (hypertension). It may also reduce your risk for type 2 diabetes, heart disease, and stroke.  With the DASH eating plan, you should limit salt (sodium) intake to 2,300 mg a day. If you have hypertension, you may need to reduce your sodium intake to 1,500 mg a day.  When on the DASH eating plan, aim to eat more fresh fruits and vegetables, whole grains, lean proteins, low-fat dairy, and heart-healthy fats.  Work with your health care provider or diet and nutrition specialist (dietitian) to adjust your eating plan to your individual   calorie needs. This information is not intended to replace advice given to you by your health care provider. Make sure you discuss any questions you have with your health care provider. Document Released: 05/21/2011 Document Revised: 05/25/2016 Document Reviewed: 05/25/2016 Elsevier Interactive Patient Education  2018 Elsevier Inc.  

## 2017-10-18 NOTE — Progress Notes (Addendum)
Subjective:    Patient ID: Candice Robertson, female    DOB: 1985/07/26, 32 y.o.   MRN: [id number]  HPI F/U GAD -in regards to her anxiety she is overall actually doing well.  She really just uses a half a tab of buspirone as needed.  She is more concerned about her recent elevation in blood pressure.  She says last several times that she is been to doctor's offices her systolic pressure has been mostly running in the 140s.  She denies any recent changes.  In fact she is actually been exercising more than usual to try to lower her blood pressure.  She denies any other new medications.  She really only takes Prilosec.  No chest pain or shortness of breath.  Last time we checked her thyroid was February 2018.  She does report nightly snoring.  And some occasional headaches on and off.  She does report feeling tired all the time.   Review of Systems  BP (!) 145/85   Pulse (!) 120   Ht 5' (1.524 m)   Wt 159 lb (72.1 kg)   BMI 31.05 kg/m     Allergies  Allergen Reactions  . Codeine Phosphate     REACTION: nausea, vomiting, severe migraines and neck pain  . Zoloft [Sertraline Hcl] Other (See Comments)    Delusions.  Skin crawling sensation.     Past Medical History:  Diagnosis Date  . Anxiety   . Anxiety disorder   . Breech presentation   . Dizziness   . GERD (gastroesophageal reflux disease)   . History of chicken pox   . History of gestational hypertension 10/14/2011  . Hypertension    With pregnancy only  . PP care - s/p 1C/S - breech 5/1 10/14/2011  . Tachycardia    with 2016 pregancy only    Past Surgical History:  Procedure Laterality Date  . CESAREAN SECTION  10/14/2011   Procedure: CESAREAN SECTION;  Surgeon: Lovenia Kim, MD;  Location: [facility];  Service: Gynecology;  Laterality: N/A;  Primary EDD: 10/21/11  . CESAREAN SECTION N/A 03/05/2015   Procedure: Repeat CESAREAN SECTION;  Surgeon: Brien Few, MD;  Location: [facility];  Service: Obstetrics;  Laterality: N/A;  EDD:  03/26/15   . TONSILECTOMY/ADENOIDECTOMY WITH MYRINGOTOMY  1991  . TONSILLECTOMY    . TYMPANOSTOMY TUBE PLACEMENT  1989    Social History   Socioeconomic History  . Marital status: Married    Spouse name: Not on file  . Number of children: Not on file  . Years of education: Not on file  . Highest education level: Not on file  Occupational History  . Not on file  Social Needs  . Financial resource strain: Not on file  . Food insecurity:    Worry: Not on file    Inability: Not on file  . Transportation needs:    Medical: Not on file    Non-medical: Not on file  Tobacco Use  . Smoking status: Former Smoker    Last attempt to quit: 10/07/2009    Years since quitting: 8.0  . Smokeless tobacco: Never Used  Substance and Sexual Activity  . Alcohol use: Yes    Comment: socially but none with pregnancy  . Drug use: No  . Sexual activity: Not on file  Lifestyle  . Physical activity:    Days per week: Not on file    Minutes per session: Not on file  . Stress: Not on file  Relationships  .  Social connections:    Talks on phone: Not on file    Gets together: Not on file    Attends religious service: Not on file    Active member of club or organization: Not on file    Attends meetings of clubs or organizations: Not on file    Relationship status: Not on file  . Intimate partner violence:    Fear of current or ex partner: Not on file    Emotionally abused: Not on file    Physically abused: Not on file    Forced sexual activity: Not on file  Other Topics Concern  . Not on file  Social History Narrative   1 cup of coffee in the morning. No other caffeine. No regular exercise.    Family History  Problem Relation Age of Onset  . Alcohol abuse Maternal Uncle   . Diabetes Maternal Uncle   . Heart attack Maternal Grandmother   . Depression Maternal Grandmother     Outpatient Encounter Medications as of 10/18/2017  Medication Sig  . busPIRone (BUSPAR) 5 MG tablet Take 0.5  tablets (2.5 mg total) by mouth 2 (two) times daily as needed (anxiety).  . Ibuprofen 200 MG CAPS Take as needed by mouth.  Marland Kitchen omeprazole (PRILOSEC) 20 MG capsule Take 1 capsule (20 mg total) by mouth daily. Take one tablet daily (Patient taking differently: Take 20 mg as needed by mouth. Take one tablet daily)  . [DISCONTINUED] busPIRone (BUSPAR) 5 MG tablet Take 0.5 tablets (2.5 mg total) by mouth 2 (two) times daily as needed (anxiety).   No facility-administered encounter medications on file as of 10/18/2017.          Objective:   Physical Exam  Constitutional: She is oriented to person, place, and time. She appears well-developed and well-nourished.  HENT:  Head: Normocephalic and atraumatic.  Cardiovascular: Normal rate, regular rhythm and normal heart sounds.  Pulmonary/Chest: Effort normal and breath sounds normal.  Neurological: She is alert and oriented to person, place, and time.  Skin: Skin is warm and dry.  Psychiatric: She has a normal mood and affect. Her behavior is normal.        Assessment & Plan:  GAD -we discussed having her take her buspirone more consistently to see if this may also even help with her blood pressure if we can better control her anxiety symptoms.  HQ 9 score of 3 and gad 7 score of 3.  So overall really well controlled.  She is to be on Lexapro but is no longer on it.  Elevated BP -we discussed starting with conservative treatment.  Recommended the DASH diet and additional information provided.  Recommend home BP cuff to monitor. Check labs to rule out other causes.  She will go to the lab when she is able.  Also had her fill out a stop bang questionnaire.  Score was 3 which was positive for snoring, fatigue and elevated blood pressure.  Continue with regular exercise.  Morning-stop bang questionnaire score of 3 which is a positive screen.  Will discuss further at her next office visit to see if she wants to continue with possible testing.

## 2017-10-18 NOTE — Addendum Note (Signed)
Addended by: Beatrice Lecher D on: 10/18/2017 05:22 PM   Modules accepted: Level of Service

## 2017-10-26 ENCOUNTER — Ambulatory Visit: Payer: Self-pay | Admitting: Family Medicine

## 2017-10-27 NOTE — Progress Notes (Signed)
[name] [place], [place] [address] Phone: [phone] Subjective:    I'm seeing this patient by the request  of:    CC: Back pain follow-up  [id number]  Candice Robertson is a 32 y.o. female coming in with complaint of back pain. She has not had any issues since last visit.  Patient is feeling very good.  Did have a scapular dyskinesis and is actually making progress.  Patient states that she is much more aware throughout the day as well.     Past Medical History:  Diagnosis Date  . Anxiety   . Anxiety disorder   . Breech presentation   . Dizziness   . GERD (gastroesophageal reflux disease)   . History of chicken pox   . History of gestational hypertension 10/14/2011  . Hypertension    With pregnancy only  . PP care - s/p 1C/S - breech 5/1 10/14/2011  . Tachycardia    with 2016 pregancy only   Past Surgical History:  Procedure Laterality Date  . CESAREAN SECTION  10/14/2011   Procedure: CESAREAN SECTION;  Surgeon: Lovenia Kim, MD;  Location: [facility];  Service: Gynecology;  Laterality: N/A;  Primary EDD: 10/21/11  . CESAREAN SECTION N/A 03/05/2015   Procedure: Repeat CESAREAN SECTION;  Surgeon: Brien Few, MD;  Location: [facility];  Service: Obstetrics;  Laterality: N/A;  EDD: 03/26/15   . TONSILECTOMY/ADENOIDECTOMY WITH MYRINGOTOMY  1991  . TONSILLECTOMY    . TYMPANOSTOMY TUBE PLACEMENT  1989   Social History   Socioeconomic History  . Marital status: Married    Spouse name: Not on file  . Number of children: Not on file  . Years of education: Not on file  . Highest education level: Not on file  Occupational History  . Not on file  Social Needs  . Financial resource strain: Not on file  . Food insecurity:    Worry: Not on file    Inability: Not on file  . Transportation needs:    Medical: Not on file    Non-medical: Not on file  Tobacco Use  . Smoking status: Former Smoker    Last attempt to quit: 10/07/2009   Years since quitting: 8.0  . Smokeless tobacco: Never Used  Substance and Sexual Activity  . Alcohol use: Yes    Comment: socially but none with pregnancy  . Drug use: No  . Sexual activity: Not on file  Lifestyle  . Physical activity:    Days per week: Not on file    Minutes per session: Not on file  . Stress: Not on file  Relationships  . Social connections:    Talks on phone: Not on file    Gets together: Not on file    Attends religious service: Not on file    Active member of club or organization: Not on file    Attends meetings of clubs or organizations: Not on file    Relationship status: Not on file  Other Topics Concern  . Not on file  Social History Narrative   1 cup of coffee in the morning. No other caffeine. No regular exercise.   Allergies  Allergen Reactions  . Codeine Phosphate     REACTION: nausea, vomiting, severe migraines and neck pain  . Zoloft [Sertraline Hcl] Other (See Comments)    Delusions.  Skin crawling sensation.    Family History  Problem Relation Age of Onset  . Alcohol abuse Maternal Uncle   .  Diabetes Maternal Uncle   . Heart attack Maternal Grandmother   . Depression Maternal Grandmother      Past medical history, social, surgical and family history all reviewed in electronic medical record.  No pertanent information unless stated regarding to the chief complaint.   Review of Systems:Review of systems updated and as accurate as of 10/28/17  No headache, visual changes, nausea, vomiting, diarrhea, constipation, dizziness, abdominal pain, skin rash, fevers, chills, night sweats, weight loss, swollen lymph nodes, body aches, joint swelling, chest pain, shortness of breath, mood changes.  Positive muscle aches  Objective  Blood pressure 132/82, pulse 100, height 5' (1.524 m), weight 156 lb (70.8 kg), SpO2 99 %, currently breastfeeding. Systems examined below as of 10/28/17   General: No apparent distress alert and oriented x3 mood and  affect normal, dressed appropriately.  HEENT: Pupils equal, extraocular movements intact  Respiratory: Patient's speak in full sentences and does not appear short of breath  Cardiovascular: No lower extremity edema, non tender, no erythema  Skin: Warm dry intact with no signs of infection or rash on extremities or on axial skeleton.  Abdomen: Soft nontender  Neuro: Cranial nerves II through XII are intact, neurovascularly intact in all extremities with 2+ DTRs and 2+ pulses.  Lymph: No lymphadenopathy of posterior or anterior cervical chain or axillae bilaterally.  Gait normal with good balance and coordination.  MSK:  Non tender with full range of motion and good stability and symmetric strength and tone of shoulders, elbows, wrist, hip, knee and ankles bilaterally.  Back Exam:  Inspection: Unremarkable  Motion: Flexion 40 deg, Extension 25 deg, Side Bending to 30 deg bilaterally,  Rotation to 35 deg bilaterally  SLR laying: Negative  XSLR laying: Negative  Palpable tenderness: Tender to palpation in the paraspinal musculature mostly in the thoracolumbar juncture but improved from previous exam. FABER: negative. Sensory change: Gross sensation intact to all lumbar and sacral dermatomes.  Reflexes: 2+ at both patellar tendons, 2+ at achilles tendons, Babinski's downgoing.  Strength at foot  Plantar-flexion: 5/5 Dorsi-flexion: 5/5 Eversion: 5/5 Inversion: 5/5  Leg strength  Quad: 5/5 Hamstring: 5/5 Hip flexor: 5/5 Hip abductors: 5/5  Gait unremarkable.  Osteopathic findings T3 extended rotated and side bent right inhaled third rib T6 extended rotated and side bent left L2 flexed rotated and side bent right Sacrum right on right     Impression and Recommendations:     This case required medical decision making of moderate complexity.      Note: This dictation was prepared with Dragon dictation along with smaller phrase technology. Any transcriptional errors that result from  this process are unintentional.

## 2017-10-28 ENCOUNTER — Ambulatory Visit (INDEPENDENT_AMBULATORY_CARE_PROVIDER_SITE_OTHER): Payer: 59 | Admitting: Family Medicine

## 2017-10-28 ENCOUNTER — Encounter: Payer: Self-pay | Admitting: Family Medicine

## 2017-10-28 VITALS — BP 132/82 | HR 100 | Ht 60.0 in | Wt 156.0 lb

## 2017-10-28 DIAGNOSIS — G2589 Other specified extrapyramidal and movement disorders: Secondary | ICD-10-CM | POA: Diagnosis not present

## 2017-10-28 DIAGNOSIS — M999 Biomechanical lesion, unspecified: Secondary | ICD-10-CM

## 2017-10-28 NOTE — Assessment & Plan Note (Signed)
Patient has been doing remarkably well at this time.  Discussed icing regimen and home exercises.  Patient is only taking the proton pump inhibitor as needed.  Continue to work on posture and being aware of ergonomics throughout the day.  Follow-up with me again in 3 months

## 2017-10-28 NOTE — Assessment & Plan Note (Signed)
Decision today to treat with OMT was based on Physical Exam  After verbal consent patient was treated with HVLA, ME, FPR techniques in rib, thoracic, lumbar and sacral areas  Patient tolerated the procedure well with improvement in symptoms  Patient given exercises, stretches and lifestyle modifications  See medications in patient instructions if given  Patient will follow up in 12 weeks

## 2017-10-28 NOTE — Patient Instructions (Signed)
You are awesome Keep it up  Do not change a thing See me again in 3 months

## 2017-11-15 ENCOUNTER — Ambulatory Visit (INDEPENDENT_AMBULATORY_CARE_PROVIDER_SITE_OTHER): Payer: 59 | Admitting: Sports Medicine

## 2017-11-15 ENCOUNTER — Encounter: Payer: Self-pay | Admitting: Sports Medicine

## 2017-11-15 DIAGNOSIS — R002 Palpitations: Secondary | ICD-10-CM | POA: Diagnosis not present

## 2017-11-15 DIAGNOSIS — R1013 Epigastric pain: Secondary | ICD-10-CM | POA: Diagnosis not present

## 2017-11-15 LAB — COMPREHENSIVE METABOLIC PANEL
AG Ratio: 1.8 (calc) (ref 1.0–2.5)
ALT: 14 U/L (ref 6–29)
AST: 15 U/L (ref 10–30)
BUN: 14 mg/dL (ref 7–25)
CO2: 25 mmol/L (ref 20–32)
Calcium: 9.2 mg/dL (ref 8.6–10.2)
Glucose, Bld: 90 mg/dL (ref 65–99)
Total Bilirubin: 0.8 mg/dL (ref 0.2–1.2)

## 2017-11-15 LAB — AMYLASE: Amylase: 34 U/L (ref 21–101)

## 2017-11-15 LAB — CK TOTAL AND CKMB (NOT AT ARMC)
CK, MB: <0.7 ng/mL (ref 0–5.0)
Total CK: 47 U/L (ref 29–143)

## 2017-11-15 LAB — T3, FREE: T3, Free: 3.5 pg/mL (ref 2.3–4.2)

## 2017-11-15 LAB — CBC
HCT: 40.8 % (ref 35.0–45.0)
Hemoglobin: 14.1 g/dL (ref 11.7–15.5)
MCH: 29.6 pg (ref 27.0–33.0)
MCHC: 34.6 g/dL (ref 32.0–36.0)
MCV: 85.7 fL (ref 80.0–100.0)
MPV: 11.5 fL (ref 7.5–12.5)
Platelets: 179 10*3/uL (ref 140–400)
RBC: 4.76 Million/uL (ref 3.80–5.10)
RDW: 12.6 % (ref 11.0–15.0)
WBC: 6.3 Thousand/uL (ref 3.8–10.8)

## 2017-11-15 LAB — TSH: TSH: 1.05 mIU/L

## 2017-11-15 LAB — COMPREHENSIVE METABOLIC PANEL WITH GFR
Albumin: 4.6 g/dL (ref 3.6–5.1)
Alkaline phosphatase (APISO): 42 U/L (ref 33–115)
Chloride: 104 mmol/L (ref 98–110)
Creat: 0.59 mg/dL (ref 0.50–1.10)
Globulin: 2.5 g/dL (ref 1.9–3.7)
Potassium: 3.9 mmol/L (ref 3.5–5.3)
Sodium: 138 mmol/L (ref 135–146)
Total Protein: 7.1 g/dL (ref 6.1–8.1)

## 2017-11-15 LAB — D-DIMER, QUANTITATIVE: D-Dimer, Quant: 0.36 mcg/mL FEU (ref <0.50)

## 2017-11-15 LAB — T4, FREE: Free T4: 1.1 ng/dL (ref 0.8–1.8)

## 2017-11-15 LAB — LIPASE: Lipase: 16 U/L (ref 7–60)

## 2017-11-15 LAB — TROPONIN I: Troponin I: <0.01 ng/mL (ref <=0.0)

## 2017-11-15 MED ORDER — PANTOPRAZOLE SODIUM 40 MG PO TBEC
40.0000 mg | DELAYED_RELEASE_TABLET | Freq: Two times a day (BID) | ORAL | 3 refills | Status: DC
Start: 2017-11-15 — End: 2018-10-20

## 2017-11-15 MED FILL — PANTOPRAZOLE SOD DR 40 MG T: 40 | 30 days supply | Qty: 60 | Fill #0

## 2017-11-15 NOTE — Assessment & Plan Note (Signed)
Worsening palpitations, some tachycardia. Currently having viral GI type symptoms with epigastric discomfort, nausea, diarrhea. Switching to pantoprazole 40 twice a day, checking some labs including d-dimer, cardiac enzymes, amylase and lipase, CBC, CMP, TSH. Imodium over-the-counter for diarrhea. EKG is normal with the exception of a new T wave inversion in aVF, V3, V4.  No ST changes. Return to see either myself or Dr. [name] next week.

## 2017-11-15 NOTE — Assessment & Plan Note (Signed)
With diarrhea, no melena or hematochezia. Switching to pantoprazole 40 mg twice a day for a week then once a day. Checking labs.

## 2017-11-15 NOTE — Progress Notes (Signed)
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  Worsening palpitations which she has had in the past.  Denies any chest pain, shortness of breath.  Diarrhea is several times per day without blood, mucus.  Feels bloated.  She did have an echocardiogram a few years ago that was structurally normal.  Did not respond to metoprolol for her palpitations.  I reviewed the past medical history, family history, social history, surgical history, and allergies today and no changes were needed.  Please see the problem list section below in epic for further details.  Past Medical History: Past Medical History:  Diagnosis Date  . Anxiety   . Anxiety disorder   . Breech presentation   . Dizziness   . GERD (gastroesophageal reflux disease)   . History of chicken pox   . History of gestational hypertension 10/14/2011  . Hypertension    With pregnancy only  . PP care - s/p 1C/S - breech 5/1 10/14/2011  . Tachycardia    with 2016 pregancy only   Past Surgical History: Past Surgical History:  Procedure Laterality Date  . CESAREAN SECTION  10/14/2011   Procedure: CESAREAN SECTION;  Surgeon: Lovenia Kim, MD;  Location: [facility];  Service: Gynecology;  Laterality: N/A;  Primary EDD: 10/21/11  . CESAREAN SECTION N/A 03/05/2015   Procedure: Repeat CESAREAN SECTION;  Surgeon: Brien Few, MD;  Location: [facility];  Service: Obstetrics;  Laterality: N/A;  EDD: 03/26/15   . TONSILECTOMY/ADENOIDECTOMY WITH MYRINGOTOMY  1991  . TONSILLECTOMY    . TYMPANOSTOMY TUBE PLACEMENT  1989   Social History: Social History   Socioeconomic History  . Marital status: Married    Spouse name: Not on file  . Number of children: Not on file  . Years of education: Not on file  . Highest education level: Not on file  Occupational History  . Not on file  Social  Needs  . Financial resource strain: Not on file  . Food insecurity:    Worry: Not on file    Inability: Not on file  . Transportation needs:    Medical: Not on file    Non-medical: Not on file  Tobacco Use  . Smoking status: Former Smoker    Last attempt to quit: 10/07/2009    Years since quitting: 8.1  . Smokeless tobacco: Never Used  Substance and Sexual Activity  . Alcohol use: Yes    Comment: socially but none with pregnancy  . Drug use: No  . Sexual activity: Not on file  Lifestyle  . Physical activity:    Days per week: Not on file    Minutes per session: Not on file  . Stress: Not on file  Relationships  . Social connections:    Talks on phone: Not on file    Gets together: Not on file    Attends religious service: Not on file    Active member of club or organization: Not on file    Attends meetings of clubs or organizations: Not on file    Relationship status: Not on file  Other Topics Concern  . Not on file  Social History Narrative   1 cup of coffee in the morning. No other caffeine. No regular exercise.   Family History: Family History  Problem Relation Age of Onset  . Alcohol abuse Maternal Uncle   .  Diabetes Maternal Uncle   . Heart attack Maternal Grandmother   . Depression Maternal Grandmother    Allergies: Allergies  Allergen Reactions  . Codeine Phosphate     REACTION: nausea, vomiting, severe migraines and neck pain  . Zoloft [Sertraline Hcl] Other (See Comments)    Delusions.  Skin crawling sensation.    Medications: See med rec.  Review of Systems: No fevers, chills, night sweats, weight loss, chest pain, or shortness of breath.   Objective:    General: Well Developed, well nourished, and in no acute distress.  Neuro: Alert and oriented x3, extra-ocular muscles intact, sensation grossly intact.  HEENT: Normocephalic, atraumatic, pupils equal round reactive to light, neck supple, no masses, no lymphadenopathy, thyroid nonpalpable.  Skin:  Warm and dry, no rashes. Cardiac: Regular rate and rhythm, no murmurs rubs or gallops, no lower extremity edema.  Respiratory: Clear to auscultation bilaterally. Not using accessory muscles, speaking in full sentences. Abdomen: Soft, minimally tender in the epigastrium, nondistended, normal bowel sounds, no palpable masses, no guarding, rigidity, rebound tenderness, negative Murphy sign.  Twelve-lead ECG is normal, rate of 98, normal axis, new inverted T waves in aVF, V3, V4, no ST changes.  Impression and Recommendations:    PALPITATIONS, OCCASIONAL Worsening palpitations, some tachycardia. Currently having viral GI type symptoms with epigastric discomfort, nausea, diarrhea. Switching to pantoprazole 40 twice a day, checking some labs including d-dimer, cardiac enzymes, amylase and lipase, CBC, CMP, TSH. Imodium over-the-counter for diarrhea. EKG is normal with the exception of a new T wave inversion in aVF, V3, V4.  No ST changes. Return to see either myself or Dr. [name] next week.  Epigastric discomfort With diarrhea, no melena or hematochezia. Switching to pantoprazole 40 mg twice a day for a week then once a day. Checking labs.  ___________________________________________ Gwen Her. Dianah Field, M.D., ABFM., CAQSM. Primary Care and [place] [employer] [facility] [employer] [facility]

## 2017-11-18 NOTE — Addendum Note (Signed)
Addended by: Huel Cote on: 11/18/2017 02:34 PM   Modules accepted: Orders

## 2017-11-22 ENCOUNTER — Encounter: Payer: Self-pay | Admitting: Sports Medicine

## 2017-11-25 ENCOUNTER — Ambulatory Visit: Payer: Self-pay | Admitting: Sports Medicine

## 2017-11-25 ENCOUNTER — Ambulatory Visit: Payer: Self-pay | Admitting: Family Medicine

## 2018-03-03 MED FILL — PANTOPRAZOLE SOD DR 40 MG T: 40 | 30 days supply | Qty: 60 | Fill #1

## 2018-03-03 MED FILL — busPIRone HCL 5 MG TABS: 5 | 30 days supply | Qty: 30 | Fill #1

## 2018-06-14 ENCOUNTER — Other Ambulatory Visit: Payer: Self-pay

## 2018-06-14 ENCOUNTER — Encounter: Payer: Self-pay | Admitting: *Deleted

## 2018-06-14 ENCOUNTER — Emergency Department (INDEPENDENT_AMBULATORY_CARE_PROVIDER_SITE_OTHER)
Admission: EM | Admit: 2018-06-14 | Discharge: 2018-06-14 | Disposition: A | Discharge status: Home / Self Care | Payer: 59 | Source: Home / Self Care | Attending: Family Medicine | Admitting: Family Medicine

## 2018-06-14 DIAGNOSIS — J020 Streptococcal pharyngitis: Secondary | ICD-10-CM | POA: Diagnosis not present

## 2018-06-14 LAB — POCT RAPID STREP A (OFFICE): Rapid Strep A Screen: POSITIVE — AB

## 2018-06-14 MED ORDER — IBUPROFEN 400 MG PO TABS
400.0000 mg | ORAL_TABLET | Freq: Once | ORAL | Status: AC
  Administered 2018-06-14: 400 mg via ORAL

## 2018-06-14 MED ORDER — PENICILLIN V POTASSIUM 500 MG PO TABS: ORAL_TABLET | ORAL | 0 refills | Status: DC

## 2018-06-14 NOTE — Discharge Instructions (Addendum)
Try warm salt water gargles for sore throat.  ?May take Ibuprofen 200mg, 4 tabs every 8 hours with food.  ?

## 2018-06-14 NOTE — ED Triage Notes (Signed)
Pt c/o fever, sore throat, and body aches x 2 days.

## 2018-06-14 NOTE — ED Provider Notes (Signed)
[name]    CSN: [id number] Arrival date & time: 06/14/18  1146     History   Chief Complaint Chief Complaint  Patient presents with  . Sore Throat  . Fever    HPI Candice Robertson is a 32 y.o. female.   Two days ago patient suddenly developed right-side sore throat, headache, myalgias, fatigue, and chills/sweats.  She denies nasal congestion and cough.  The history is provided by the patient.    Past Medical History:  Diagnosis Date  . Anxiety   . Anxiety disorder   . Breech presentation   . Dizziness   . GERD (gastroesophageal reflux disease)   . History of chicken pox   . History of gestational hypertension 10/14/2011  . Hypertension    With pregnancy only  . PP care - s/p 1C/S - breech 5/1 10/14/2011  . Tachycardia    with 2016 pregancy only    Patient Active Problem List   Diagnosis Date Noted  . Epigastric discomfort 11/15/2017  . Scapular dyskinesis 08/17/2017  . Nonallopathic lesion of thoracic region 08/17/2017  . Nonallopathic lesion of rib cage 08/17/2017  . Nonallopathic lesion of lumbosacral region 08/17/2017  . Schwannoma 04/29/2017  . GAD (generalized anxiety disorder) 08/19/2015  . Mild preeclampsia 03/07/2015  . Iron deficiency anemia of pregnancy 03/07/2015  . Acute blood loss anemia 03/07/2015  . Autonomic dysfunction 12/13/2014  . Near syncope 12/13/2014  . History of gestational hypertension 10/14/2011  . DELAYED MENSES 07/17/2010  . PALPITATIONS, OCCASIONAL 07/14/2010  . CERVICALGIA 01/09/2010    Past Surgical History:  Procedure Laterality Date  . CESAREAN SECTION  10/14/2011   Procedure: CESAREAN SECTION;  Surgeon: Lovenia Kim, MD;  Location: [facility];  Service: Gynecology;  Laterality: N/A;  Primary EDD: 10/21/11  . CESAREAN SECTION N/A 03/05/2015   Procedure: Repeat CESAREAN SECTION;  Surgeon: Brien Few, MD;  Location: [facility];  Service: Obstetrics;  Laterality: N/A;  EDD: 03/26/15   .  TONSILECTOMY/ADENOIDECTOMY WITH MYRINGOTOMY  1991  . TONSILLECTOMY    . TYMPANOSTOMY TUBE PLACEMENT  1989    OB History    Gravida  2   Para  2   Term  2   Preterm      AB      Living  1     SAB      TAB      Ectopic      Multiple  0   Live Births  1            Home Medications    Prior to Admission medications   Medication Sig Start Date End Date Taking? Authorizing Provider  busPIRone (BUSPAR) 5 MG tablet Take 0.5 tablets (2.5 mg total) by mouth 2 (two) times daily as needed (anxiety). 10/18/17   [name], MD  pantoprazole (PROTONIX) 40 MG tablet Take 1 tablet (40 mg total) by mouth 2 (two) times daily. 11/15/17   Silverio Decamp, MD  penicillin v potassium (VEETID) 500 MG tablet Take one tab by mouth twice daily for 10 days 06/14/18   Kandra Nicolas, MD    Family History Family History  Problem Relation Age of Onset  . Alcohol abuse Maternal Uncle   . Diabetes Maternal Uncle   . Heart attack Maternal Grandmother   . Depression Maternal Grandmother     Social History Social History   Tobacco Use  . Smoking status: Former Smoker    Last attempt to quit: 10/07/2009  Years since quitting: 8.6  . Smokeless tobacco: Never Used  Substance Use Topics  . Alcohol use: Yes    Comment: socially but none with pregnancy  . Drug use: No     Allergies   Codeine phosphate and Zoloft [sertraline hcl]   Review of Systems Review of Systems + sore throat No cough No pleuritic pain No wheezing No nasal congestion ? post-nasal drainage No sinus pain/pressure No itchy/red eyes ? earache No hemoptysis No SOB ? fever, + chills/sweats No nausea No vomiting No abdominal pain No diarrhea No urinary symptoms No skin rash + fatigue + myalgias + headache Used OTC meds without relief   Physical Exam Triage Vital Signs ED Triage Vitals  Enc Vitals Group     BP 06/14/18 1300 (!) 155/87     Pulse Rate 06/14/18 1300 (!) 136      Resp 06/14/18 1300 18     Temp 06/14/18 1300 (!) 101.4 F (38.6 C)     Temp Source 06/14/18 1300 Oral     SpO2 06/14/18 1300 100 %     Weight 06/14/18 1301 149 lb (67.6 kg)     Height 06/14/18 1301 5' (1.524 m)     Head Circumference --      Peak Flow --      Pain Score 06/14/18 1301 0     Pain Loc --      Pain Edu? --      Excl. in [place]? --    No data found.  Updated Vital Signs BP (!) 155/87 (BP Location: Right Arm)   Pulse (!) 136   Temp (!) 101.4 F (38.6 C) (Oral)   Resp 18   Ht 5' (1.524 m)   Wt 67.6 kg   LMP 05/31/2018   SpO2 100%   BMI 29.10 kg/m   Visual Acuity Right Eye Distance:   Left Eye Distance:   Bilateral Distance:    Right Eye Near:   Left Eye Near:    Bilateral Near:     Physical Exam Nursing notes and Vital Signs reviewed. Appearance:  Patient appears stated age, and in no acute distress Eyes:  Pupils are equal, round, and reactive to light and accomodation.  Extraocular movement is intact.  Conjunctivae are not inflamed  Ears:  Canals normal.  Tympanic membranes normal.  Nose:   Normal turbinates.  No sinus tenderness.   Pharynx:  Erythematous, including uvula. Neck:  Supple.  Enlarged tender tonsillar nodes. Lungs:  Clear to auscultation.  Breath sounds are equal.  Moving air well. Heart:  Regular rate and rhythm without murmurs, rubs, or gallops.  Abdomen:  Nontender without masses or hepatosplenomegaly.  Bowel sounds are present.  No CVA or flank tenderness.  Extremities:  No edema.  Skin:  No rash present.    UC Treatments / Results  Labs (all labs ordered are listed, but only abnormal results are displayed) Labs Reviewed  POCT RAPID STREP A (OFFICE) - Abnormal; Notable for the following components:      Result Value   Rapid Strep A Screen Positive (*)    All other components within normal limits    EKG None  Radiology No results found.  Procedures Procedures (including critical care time)  Medications Ordered in  UC Medications  ibuprofen (ADVIL,MOTRIN) tablet 400 mg (400 mg Oral Given 06/14/18 1305)    Initial Impression / Assessment and Plan / UC Course  I have reviewed the triage vital signs and the nursing notes.  Pertinent  labs & imaging results that were available during my care of the patient were reviewed by me and considered in my medical decision making (see chart for details).    Begin Pen VK for 10 days. Followup with Family Doctor if not improved in 10 days.   Final Clinical Impressions(s) / UC Diagnoses   Final diagnoses:  Streptococcal sore throat     Discharge Instructions     Try warm salt water gargles for sore throat.  May take Ibuprofen 200mg , 4 tabs every 8 hours with food.     ED Prescriptions    Medication Sig Dispense Auth. Provider   penicillin v potassium (VEETID) 500 MG tablet Take one tab by mouth twice daily for 10 days 20 tablet Kandra Nicolas, MD        Kandra Nicolas, MD 06/25/18 [phone]

## 2018-08-24 ENCOUNTER — Encounter: Payer: Self-pay | Admitting: Family Medicine

## 2018-08-24 ENCOUNTER — Other Ambulatory Visit: Payer: Self-pay

## 2018-08-24 ENCOUNTER — Ambulatory Visit (INDEPENDENT_AMBULATORY_CARE_PROVIDER_SITE_OTHER): Payer: 59 | Admitting: Family Medicine

## 2018-08-24 VITALS — BP 149/96 | HR 109 | Ht 60.0 in | Wt 147.0 lb

## 2018-08-24 DIAGNOSIS — D3610 Benign neoplasm of peripheral nerves and autonomic nervous system, unspecified: Secondary | ICD-10-CM

## 2018-08-24 DIAGNOSIS — Z862 Personal history of diseases of the blood and blood-forming organs and certain disorders involving the immune mechanism: Secondary | ICD-10-CM | POA: Diagnosis not present

## 2018-08-24 DIAGNOSIS — R42 Dizziness and giddiness: Secondary | ICD-10-CM

## 2018-08-24 DIAGNOSIS — R002 Palpitations: Secondary | ICD-10-CM | POA: Diagnosis not present

## 2018-08-24 DIAGNOSIS — E049 Nontoxic goiter, unspecified: Secondary | ICD-10-CM

## 2018-08-24 DIAGNOSIS — R9431 Abnormal electrocardiogram [ECG] [EKG]: Secondary | ICD-10-CM

## 2018-08-24 NOTE — Patient Instructions (Signed)
Thank you for coming in today.  I think this may be a balance (vestibular) issue.  Plan to refer to physical therapy for vestibular PT.  Get labs now.  You should hear about referral to Dr Stanford Breed for QT and EKG.  Avoid medicines that prolong QT segment.   I will get results back to you soonish.  Recheck with me or Dr [name] as needed.    Work on sleep.   Long QT Syndrome  Long QT syndrome (LQTS) is a heart condition in which the heart takes longer than normal to recharge after each heartbeat. This is caused by an abnormal electrical system in the heart. LQTS can upset the timing of your heartbeats. It can cause dangerous changes in your heart rate and rhythm (arrhythmia). There are several types of LQTS. The three most common types are:  Type 1. This can be triggered by stress or exercise, especially swimming.  Type 2. This can be triggered by strong emotions or surprise.  Type 3. This can be triggered when your heart slows during sleep. You can be born with LQTS, or you can develop it later in life. What are the causes? The cause of this condition depends on the type of LQTS that you have.  Inherited LQTS. You are born with this condition. It is caused by an abnormal gene that is passed down through your family.  Acquired LQTS. You get this condition later in life. It may be caused by: ? Certain medicines that affect your heartbeat. Some examples are methadone and antihistamines. ? Long periods of vomiting or diarrhea. ? An eating disorder. ? A thyroid disorder. What increases the risk? This condition is more likely to develop in:  People who are born deaf.  Women.  People who have an eating disorder, such as anorexia nervosa or bulimia.  People who have a family member with LQTS.  People who have a family history of unexplained fainting, drowning, or sudden death. What are the signs or symptoms? Symptoms of this condition include:  Fainting.  A fluttering  feeling in your chest.  Loud gasping during sleep.  Seizures. Symptoms of inherited LQTS almost always start before age 23.  Some people with this condition have no symptoms. How is this diagnosed? This condition may be diagnosed based on:  Your symptoms.  Your medical history and family history.  A physical exam.  Some tests, including: ? An electrocardiogram (ECG) to measure electrical activity in your heart. ? Holter monitoring to record your heartbeat for 1-2 days. ? Stress test to record your heartbeat while you exercise. ? A blood test to look for genes that cause LQTS. How is this treated? There is no cure for this condition. Treatment depends on the cause, your symptoms, and whether you have a family history of sudden death. Treatment may include:  Making lifestyle changes, such as avoiding competitive sports or stressful situations.  Taking supplements to correct abnormal salt (sodium), potassium, calcium, and magnesium levels.  Stopping the use of a medicine. Do not stop the use of medicines without first talking to your health care provider.  Taking heart medicines, such as beta blockers.  Implanting a device that corrects a dangerous heartbeat, such as: ? A pacemaker. This helps your heart beat in a normal rhythm. ? A cardioverter-defibrillator. This senses a fast heartbeat and shocks the heart to restore normal heart rate.  Having heart surgery to prevent arrhythmias. Follow these instructions at home: Medicine  Take over-the-counter and prescription medicines  only as told by your health care provider.  If you want to take any new medicine, get approval from your health care provider first. Avoid any medicines that can cause this condition. Lifestyle  Make any lifestyle changes that are recommended by your health care provider. You may need to avoid: ? Competitive sports. ? Strenuous exercises and activities, such as swimming. ? Stress. ? Situations where  sudden loud noises are likely.  If you drink alcohol, limit how much you have: ? 0-2 drinks a day for men. ? 0-1 drink a day for women.  Be aware of how much alcohol is in your drink. In the [place] one drink equals one typical bottle of beer (12 oz), one-half glass of wine (5 oz), or one shot of hard liquor (1 oz).  Do not use any products that contain nicotine or tobacco, such as cigarettes and e-cigarettes. If you need help quitting, ask your health care provider. General instructions  Develop a plan with your health care provider for how to deal with a sudden arrhythmia.  Tell people who live with you about the signs of a sudden arrhythmia.  Wear a medical ID necklace or bracelet that states your diagnosis and contact information.  Have an automated external defibrillator (AED) available at home or work.  Get treatment and support if you feel stress, fear, anxiety, or depression.  Keep all follow-up visits as told by your health care provider. This is important. Contact a health care provider if:  You are suffering from stress, fear, anxiety, or depression.  You vomit.  You have diarrhea. Get help right away if:  You have chest pain or difficulty breathing.  You have a fluttering feeling in your chest.  You faint.  You have a seizure. These symptoms may represent a serious problem that is an emergency. Do not wait to see if the symptoms will go away. Get medical help right away. Call your local emergency services (911 in the [place] Do not drive yourself to the hospital. Summary  Long QT syndrome (LQTS) is a heart condition in which your heart takes longer than normal to recharge after each heartbeat. This is caused by an abnormal electrical system in your heart.  LQTS can upset the timing of your heartbeats and cause dangerous changes in your heart rate and rhythm (arrhythmia).  Some people are born with LQTS (inherited). Others develop it later in life (acquired).   Some people with this condition have no symptoms. Those who do have symptoms may experience fainting, a fluttering feeling in the chest, loud gasping during sleep, or seizures.  There is no cure for this condition. Treatment depends on the cause, your symptoms, and whether you have a family history of sudden death. This information is not intended to replace advice given to you by your health care provider. Make sure you discuss any questions you have with your health care provider. Document Released: 03/29/2009 Document Revised: 06/29/2017 Document Reviewed: 06/29/2017 Elsevier Interactive Patient Education  2019 [employer].

## 2018-08-24 NOTE — Progress Notes (Signed)
Candice Robertson is a 33 y.o. female who presents to [facility]: [place] today for vague dizziness.  Candice Robertson notes a 3-week history of feeling vaguely dizzy and unstable when standing and walking.  She denies significant room spinning sensation but feels vaguely floaty or unbalanced with normal walking.  She notes this is worse in the morning and typically improves throughout the day.  She denies lightheadedness or feeling like she is going to pass out.  She denies chest pain or palpitations or shortness of breath.  She denies any falls or head injury.  She had a bad episode of this today and called EMS to her house.  CBG was normal at 112.    Patient is a pertinent past medical history for palpitations.  She has had cardiac work-up in the past including echocardiogram in 2016 it was read as normal, and several EKGs most recently June 2019 showing flattened lateral precordial leads Twaves with a normal QT segment.  She has a history of anxiety and will occasionally take BuSpar but has not been taking it super frequently with these episodes.  Additionally she has a history of schwannoma in her presacral area measuring 42 x 43 mL arising from left S1 nerve root.  This was last visualized on MRI L-spine January 2019.  She denies any history of schwannoma in her skull.  She had normal brain MRI with and without contrast March 2018.Marland Kitchen  ROS as above:  Exam:  BP (!) 149/96   Pulse (!) 109   Ht 5' (1.524 m)   Wt 147 lb (66.7 kg)   BMI 28.71 kg/m   Orthostatic VS for the past 24 hrs:  BP- Lying Pulse- Lying BP- Sitting Pulse- Sitting  08/24/18 1458 (!) 138/92 103 (!) 145/91 116     Wt Readings from Last 5 Encounters:  08/24/18 147 lb (66.7 kg)  06/14/18 149 lb (67.6 kg)  11/15/17 153 lb (69.4 kg)  10/28/17 156 lb (70.8 kg)  10/18/17 159 lb (72.1 kg)    Gen: Well NAD HEENT: EOMI,   MMM slight retracted tympanic membranes bilaterally.  Normal posterior pharynx.  No cervical lymphadenopathy.  Slightly full thyroid without nodules.  Nontender. Lungs: Normal work of breathing. CTABL Heart: RRR no MRG heart rate 83 bpm per my check. Abd: NABS, Soft. Nondistended, Nontender Exts: Brisk capillary refill, warm and well perfused.   Lab and Radiology Results MRI brain, MRI lumbar spine, echocardiogram, and prior EKGs reviewed.  Twelve-lead EKG shows normal sinus rhythm at 83 bpm.  No ST segment elevation or depression.  Slightly flattened lateral precordial T waves.  QTc slightly prolonged at 481.  Appearance of T waves not changed from prior study June 2019.  QTc June 2016 was 444.  This is a significant change.   Assessment and Plan: 33 y.o. female with dizziness.  Patient describes symptoms most commonly seen with disequilibrium or vestibular issues.  She does have a cardiac history and slightly prolonged QTC on today's EKG.  I think it is reasonable to proceed with work-up including metabolic work-up listed below.  Additionally refer to cardiology to follow-up prolonged QT segment.  Additionally recommend avoiding QT prolonging medications.  Lastly we will proceed with trial of vestibular physical therapy.  Recheck with primary care provider or me in the near future if not improving or if worsening.  PDMP not reviewed this encounter. Orders Placed This Encounter  Procedures  . CBC  .  COMPLETE METABOLIC PANEL WITH GFR  . TSH  . Magnesium  . Iron, TIBC and Ferritin Panel  . T4, free  . T3, free  . Ambulatory referral to Physical Therapy    Referral Priority:   Routine    Referral Type:   Physical Medicine    Referral Reason:   Specialty Services Required    Requested Specialty:   Physical Therapy  . Ambulatory referral to Cardiology    Referral Priority:   Routine    Referral Type:   Consultation    Referral Reason:   Specialty Services Required    Referred to  Provider:   Lelon Perla, MD    Requested Specialty:   Cardiology    Number of Visits Requested:   1  . EKG 12-Lead   No orders of the defined types were placed in this encounter.    Historical information moved to improve visibility of documentation.  Past Medical History:  Diagnosis Date  . Anxiety   . Anxiety disorder   . Breech presentation   . Dizziness   . GERD (gastroesophageal reflux disease)   . History of chicken pox   . History of gestational hypertension 10/14/2011  . Hypertension    With pregnancy only  . PP care - s/p 1C/S - breech 5/1 10/14/2011  . Tachycardia    with 2016 pregancy only   Past Surgical History:  Procedure Laterality Date  . CESAREAN SECTION  10/14/2011   Procedure: CESAREAN SECTION;  Surgeon: Lovenia Kim, MD;  Location: [facility];  Service: Gynecology;  Laterality: N/A;  Primary EDD: 10/21/11  . CESAREAN SECTION N/A 03/05/2015   Procedure: Repeat CESAREAN SECTION;  Surgeon: Brien Few, MD;  Location: [facility];  Service: Obstetrics;  Laterality: N/A;  EDD: 03/26/15   . TONSILECTOMY/ADENOIDECTOMY WITH MYRINGOTOMY  1991  . TONSILLECTOMY    . TYMPANOSTOMY TUBE PLACEMENT  1989   Social History   Tobacco Use  . Smoking status: Former Smoker    Last attempt to quit: 10/07/2009    Years since quitting: 8.8  . Smokeless tobacco: Never Used  Substance Use Topics  . Alcohol use: Yes    Comment: socially but none with pregnancy   family history includes Alcohol abuse in her maternal uncle; Depression in her maternal grandmother; Diabetes in her maternal uncle; Heart attack in her maternal grandmother.  Medications: Current Outpatient Medications  Medication Sig Dispense Refill  . busPIRone (BUSPAR) 5 MG tablet Take 0.5 tablets (2.5 mg total) by mouth 2 (two) times daily as needed (anxiety). 30 tablet prn  . pantoprazole (PROTONIX) 40 MG tablet Take 1 tablet (40 mg total) by mouth 2 (two) times daily. 60 tablet 3   No current  facility-administered medications for this visit.    Allergies  Allergen Reactions  . Codeine Phosphate     REACTION: nausea, vomiting, severe migraines and neck pain  . Zoloft [Sertraline Hcl] Other (See Comments)    Delusions.  Skin crawling sensation.      Discussed warning signs or symptoms. Please see discharge instructions. Patient expresses understanding.

## 2018-08-25 ENCOUNTER — Ambulatory Visit: Payer: Self-pay | Admitting: Family Medicine

## 2018-08-25 LAB — COMPLETE METABOLIC PANEL WITH GFR
AG Ratio: 1.7 (calc) (ref 1.0–2.5)
ALBUMIN MSPROF: 4.7 g/dL (ref 3.6–5.1)
ALT: 15 U/L (ref 6–29)
AST: 17 U/L (ref 10–30)
Alkaline phosphatase (APISO): 44 U/L (ref 31–125)
BUN: 16 mg/dL (ref 7–25)
CHLORIDE: 103 mmol/L (ref 98–110)
CO2: 27 mmol/L (ref 20–32)
Calcium: 9.8 mg/dL (ref 8.6–10.2)
Creat: 0.77 mg/dL (ref 0.50–1.10)
GFR, Est African American: 118 mL/min/{1.73_m2} (ref >=60)
GFR, Est Non African American: 102 mL/min/{1.73_m2} (ref >=60)
Globulin: 2.8 g/dL (calc) (ref 1.9–3.7)
Glucose, Bld: 85 mg/dL (ref 65–99)
Potassium: 4 mmol/L (ref 3.5–5.3)
Sodium: 139 mmol/L (ref 135–146)
Total Bilirubin: 0.3 mg/dL (ref 0.2–1.2)
Total Protein: 7.5 g/dL (ref 6.1–8.1)

## 2018-08-25 LAB — MAGNESIUM: Magnesium: 2.1 mg/dL (ref 1.5–2.5)

## 2018-08-25 LAB — IRON,TIBC AND FERRITIN PANEL
%SAT: 11 % (calc) — ABNORMAL LOW (ref 16–45)
Ferritin: 44 ng/mL (ref 16–154)
Iron: 39 ug/dL — ABNORMAL LOW (ref 40–190)
TIBC: 353 ug/dL (ref 250–450)

## 2018-08-25 LAB — CBC
HCT: 38.7 % (ref 35.0–45.0)
Hemoglobin: 12.9 g/dL (ref 11.7–15.5)
MCH: 29.3 pg (ref 27.0–33.0)
MCHC: 33.3 g/dL (ref 32.0–36.0)
MCV: 87.8 fL (ref 80.0–100.0)
MPV: 11.2 fL (ref 7.5–12.5)
Platelets: 224 10*3/uL (ref 140–400)
RBC: 4.41 10*6/uL (ref 3.80–5.10)
RDW: 12.8 % (ref 11.0–15.0)
WBC: 5.1 10*3/uL (ref 3.8–10.8)

## 2018-08-25 LAB — T3, FREE: T3, Free: 2.8 pg/mL (ref 2.3–4.2)

## 2018-08-25 LAB — T4, FREE: Free T4: 1 ng/dL (ref 0.8–1.8)

## 2018-08-25 LAB — TSH: TSH: 1.23 mIU/L

## 2018-08-26 ENCOUNTER — Encounter: Payer: Self-pay | Admitting: Family Medicine

## 2018-08-26 DIAGNOSIS — F411 Generalized anxiety disorder: Secondary | ICD-10-CM

## 2018-08-26 MED ORDER — PAROXETINE HCL 20 MG PO TABS: ORAL_TABLET | ORAL | 0 refills | Status: DC

## 2018-08-26 MED FILL — PARoxetine HCL 20 MG TABS: 20 | 30 days supply | Qty: 30 | Fill #0

## 2018-08-30 NOTE — Addendum Note (Signed)
Addended by: Beatrice Lecher D on: 08/30/2018 05:45 PM   Modules accepted: Orders

## 2018-08-31 ENCOUNTER — Ambulatory Visit: Payer: Self-pay | Admitting: Rehabilitative and Restorative Service Providers"

## 2018-09-02 ENCOUNTER — Telehealth: Payer: Self-pay | Admitting: Rehabilitative and Restorative Service Providers"

## 2018-09-02 NOTE — Telephone Encounter (Signed)
Left message for patient to call to re-schedule eval. [facility] has closed all OP rehab clinics until 09/19/2018. Patient will be evaluated as she calls to schedule.   Celyn P. Helene Kelp PT, MPH 09/02/18 3:30 PM

## 2018-09-07 ENCOUNTER — Telehealth: Payer: Self-pay | Admitting: *Deleted

## 2018-09-07 ENCOUNTER — Encounter: Payer: Self-pay | Admitting: *Deleted

## 2018-09-07 ENCOUNTER — Ambulatory Visit: Payer: Self-pay | Admitting: Rehabilitative and Restorative Service Providers"

## 2018-09-07 NOTE — Telephone Encounter (Signed)
Called pt to ask her about her FMLA forms. No answer or VM will send a my chart message.Candice Robertson, Lahoma Crocker, CMA

## 2018-10-17 ENCOUNTER — Other Ambulatory Visit: Payer: Self-pay | Admitting: Family Medicine

## 2018-10-17 MED ORDER — PAROXETINE HCL 20 MG PO TABS: 20.0000 mg | ORAL_TABLET | Freq: Every day | ORAL | 0 refills | Status: DC

## 2018-10-17 MED FILL — PARoxetine HCL 20 MG TABS: 20 | 30 days supply | Qty: 30 | Fill #0

## 2018-10-17 NOTE — Telephone Encounter (Signed)
Pt advised and scheduled.

## 2018-10-17 NOTE — Telephone Encounter (Signed)
Schedule for follow-up virtual vis.it we have not followed up since she has started the Paxil.

## 2018-10-18 ENCOUNTER — Encounter: Payer: Self-pay | Admitting: Family Medicine

## 2018-10-19 ENCOUNTER — Ambulatory Visit: Payer: Self-pay

## 2018-10-19 ENCOUNTER — Other Ambulatory Visit: Payer: Self-pay

## 2018-10-19 ENCOUNTER — Ambulatory Visit (INDEPENDENT_AMBULATORY_CARE_PROVIDER_SITE_OTHER): Payer: 59 | Admitting: Family Medicine

## 2018-10-19 ENCOUNTER — Encounter: Payer: Self-pay | Admitting: Family Medicine

## 2018-10-19 VITALS — BP 128/88 | HR 87 | Ht 60.0 in | Wt 146.0 lb

## 2018-10-19 DIAGNOSIS — M79644 Pain in right finger(s): Secondary | ICD-10-CM

## 2018-10-19 DIAGNOSIS — M65311 Trigger thumb, right thumb: Secondary | ICD-10-CM | POA: Diagnosis not present

## 2018-10-19 DIAGNOSIS — G8929 Other chronic pain: Secondary | ICD-10-CM

## 2018-10-19 MED ORDER — DICLOFENAC SODIUM 2 % TD SOLN: 2.0000 g | Freq: Two times a day (BID) | TRANSDERMAL | 3 refills | Status: AC

## 2018-10-19 NOTE — Progress Notes (Signed)
[name] [address] [place], [place] [address] Phone: [phone] Subjective:      CC: Right thumb and hand pain  [id number]  Candice Robertson is a 33 y.o. female coming in with complaint of right thumb trigger finger. Has been going on since October. Does have locking at night and is worse in the cold.   Patient describes the pain as a dull, throbbing aching sensation.  Patient states that it seems to get stuck in a flexed position at night.  Patient states that it is been going on since October and seems to be worsening.  Has not tried anything specific for it though.  Past Medical History:  Diagnosis Date  . Anxiety   . Anxiety disorder   . Breech presentation   . Dizziness   . GERD (gastroesophageal reflux disease)   . History of chicken pox   . History of gestational hypertension 10/14/2011  . Hypertension    With pregnancy only  . PP care - s/p 1C/S - breech 5/1 10/14/2011  . Tachycardia    with 2016 pregancy only   Past Surgical History:  Procedure Laterality Date  . CESAREAN SECTION  10/14/2011   Procedure: CESAREAN SECTION;  Surgeon: Lovenia Kim, MD;  Location: [facility];  Service: Gynecology;  Laterality: N/A;  Primary EDD: 10/21/11  . CESAREAN SECTION N/A 03/05/2015   Procedure: Repeat CESAREAN SECTION;  Surgeon: Brien Few, MD;  Location: [facility];  Service: Obstetrics;  Laterality: N/A;  EDD: 03/26/15   . TONSILECTOMY/ADENOIDECTOMY WITH MYRINGOTOMY  1991  . TONSILLECTOMY    . TYMPANOSTOMY TUBE PLACEMENT  1989   Social History   Socioeconomic History  . Marital status: Married    Spouse name: Not on file  . Number of children: Not on file  . Years of education: Not on file  . Highest education level: Not on file  Occupational History  . Not on file  Social Needs  . Financial resource strain: Not on file  . Food insecurity:    Worry: Not on file    Inability: Not on file  . Transportation needs:    Medical: Not on  file    Non-medical: Not on file  Tobacco Use  . Smoking status: Former Smoker    Last attempt to quit: 10/07/2009    Years since quitting: 9.0  . Smokeless tobacco: Never Used  Substance and Sexual Activity  . Alcohol use: Yes    Comment: socially but none with pregnancy  . Drug use: No  . Sexual activity: Not on file  Lifestyle  . Physical activity:    Days per week: Not on file    Minutes per session: Not on file  . Stress: Not on file  Relationships  . Social connections:    Talks on phone: Not on file    Gets together: Not on file    Attends religious service: Not on file    Active member of club or organization: Not on file    Attends meetings of clubs or organizations: Not on file    Relationship status: Not on file  Other Topics Concern  . Not on file  Social History Narrative   1 cup of coffee in the morning. No other caffeine. No regular exercise.   Allergies  Allergen Reactions  . Codeine Phosphate     REACTION: nausea, vomiting, severe migraines and neck pain  . Zoloft [Sertraline Hcl] Other (See Comments)  Delusions.  Skin crawling sensation.    Family History  Problem Relation Age of Onset  . Alcohol abuse Maternal Uncle   . Diabetes Maternal Uncle   . Heart attack Maternal Grandmother   . Depression Maternal Grandmother          Current Outpatient Medications (Other):  .  busPIRone (BUSPAR) 5 MG tablet, Take 0.5 tablets (2.5 mg total) by mouth 2 (two) times daily as needed (anxiety). .  pantoprazole (PROTONIX) 40 MG tablet, Take 1 tablet (40 mg total) by mouth 2 (two) times daily. Marland Kitchen  PARoxetine (PAXIL) 20 MG tablet, Take 1 tablet (20 mg total) by mouth daily. .  Diclofenac Sodium 2 % SOLN, Place 2 g onto the skin 2 (two) times daily.    Past medical history, social, surgical and family history all reviewed in electronic medical record.  No pertanent information unless stated regarding to the chief complaint.   Review of Systems:  No  headache, visual changes, nausea, vomiting, diarrhea, constipation, dizziness, abdominal pain, skin rash, fevers, chills, night sweats, weight loss, swollen lymph nodes, body aches, joint swelling, muscle aches, chest pain, shortness of breath, mood changes.   Objective  Blood pressure 128/88, pulse 87, height 5' (1.524 m), weight 146 lb (66.2 kg), SpO2 98 %, currently breastfeeding. f    General: No apparent distress alert and oriented x3 mood and affect normal, dressed appropriately.  HEENT: Pupils equal, extraocular movements intact  Respiratory: Patient's speak in full sentences and does not appear short of breath  Cardiovascular: No lower extremity edema, non tender, no erythema  Skin: Warm dry intact with no signs of infection or rash on extremities or on axial skeleton.  Abdomen: Soft nontender  Neuro: Cranial nerves II through XII are intact, neurovascularly intact in all extremities with 2+ DTRs and 2+ pulses.  Lymph: No lymphadenopathy of posterior or anterior cervical chain or axillae bilaterally.  Gait normal with good balance and coordination.  MSK:  Non tender with full range of motion and good stability and symmetric strength and tone of shoulders, elbows, wrist, hip, knee and ankles bilaterally.  Right hand exam shows no thenar remarkable on inspection.  Patient normal on palpation shows a trigger nodule noted at the A2 pulley of the thumb noted.  Severely tender to palpation.  Does have full range of motion but triggering is noted.  Neurovascular intact distally.  Negative Tinel's.  Full range of motion of the wrist.  Limited musculoskeletal ultrasound with interpreted by Lyndal Pulley  Limited ultrasound of patient's flexor tendon sheath shows hypoechoic changes.  Mild trigger nodule noted.  Patient does not have any true tearing appreciated.  Impression: Trigger thumb   Impression and Recommendations:     This case required medical decision making of moderate  complexity. The above documentation has been reviewed and is accurate and complete Lyndal Pulley, DO       Note: This dictation was prepared with Dragon dictation along with smaller phrase technology. Any transcriptional errors that result from this process are unintentional.

## 2018-10-19 NOTE — Telephone Encounter (Signed)
I see that Dr. Georgina Snell had placed a referral in March.  It probably got delayed because of COVID.  Can we please call cardiology and find out when she might be scheduled and just to make sure that she is in their work you to be scheduled.  She still having symptoms of palpitations.

## 2018-10-19 NOTE — Assessment & Plan Note (Signed)
Patient does have more of a trigger thumb.  We discussed with patient in great length about avoiding repetitive activity.  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  We discussed which activities of doing which he started last.  Fails conservative therapy in 3 weeks we will see patient back again in June injection.

## 2018-10-19 NOTE — Patient Instructions (Signed)
Gooto see you  Ice is your friend pennsaid pinkie amount topically 2 times daily as needed.  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

## 2018-10-20 ENCOUNTER — Telehealth (INDEPENDENT_AMBULATORY_CARE_PROVIDER_SITE_OTHER): Payer: 59 | Admitting: Family Medicine

## 2018-10-20 ENCOUNTER — Encounter: Payer: Self-pay | Admitting: Family Medicine

## 2018-10-20 VITALS — BP 128/88 | HR 87 | Ht 60.0 in | Wt 146.0 lb

## 2018-10-20 DIAGNOSIS — K219 Gastro-esophageal reflux disease without esophagitis: Secondary | ICD-10-CM

## 2018-10-20 DIAGNOSIS — F411 Generalized anxiety disorder: Secondary | ICD-10-CM | POA: Diagnosis not present

## 2018-10-20 MED ORDER — PANTOPRAZOLE SODIUM 40 MG PO TBEC: 40.0000 mg | DELAYED_RELEASE_TABLET | Freq: Every day | ORAL | 1 refills | Status: AC

## 2018-10-20 MED ORDER — PAROXETINE HCL 10 MG PO TABS: 10.0000 mg | ORAL_TABLET | Freq: Every day | ORAL | 1 refills | Status: AC

## 2018-10-20 MED FILL — PANTOPRAZOLE SOD DR 40 MG T: 40 | 90 days supply | Qty: 90 | Fill #0

## 2018-10-20 MED FILL — PARoxetine HCL 10 MG TABS: 10 | 90 days supply | Qty: 90 | Fill #0

## 2018-10-20 NOTE — Progress Notes (Signed)
Pt is doing well on current regimen.Candice Robertson, [place]

## 2018-10-20 NOTE — Telephone Encounter (Signed)
Candice Robertson - can you look into this referral? Thank you.

## 2018-10-20 NOTE — Progress Notes (Signed)
Virtual Visit via Video Note  I connected with Candice Robertson on 10/20/18 at  9:30 AM EDT by a video enabled telemedicine application and verified that I am speaking with the correct person using two identifiers.   I discussed the limitations of evaluation and management by telemedicine and the availability of in person appointments. The patient expressed understanding and agreed to proceed.  Pt was at home and I was in my office for the virtual visit.     Subjective:    CC: yearly f/u on GAD.   HPI: F/U GAD -she more recently felt like her stress levels were increasing and even started having the occasional panic attack.  Feels like her hormones are a big trigger.  It always seems worse around her menstrual period.  She still had some old fluoxetine 20 mg tabs left over and so started to take a half of a tab about 6 weeks ago.  She feels like mood has leveled off and it has really been helping.  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  It was causing significant lack of sleep and she says she is finally just come to realize that she really does need about 8 hours of sleep to feel like mentally she is functioning well.  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  She would like to stay on the 10 mg dose.  She also had some old BuSpar but she has not been using that either.  Follow-up GERD-just uses her pantoprazole occasionally, not taking daily.  Will need refill sent to pharmacy.   Past medical history, Surgical history, Family history not pertinant except as noted below, Social history, Allergies, and medications have been entered into the medical record,  reviewed, and corrections made.   Review of Systems: No fevers, chills, night sweats, weight loss, chest pain, or shortness of breath.   Objective:    General: Speaking clearly in complete sentences without any shortness of breath.  Alert and oriented x3.  Normal judgment. No apparent acute distress.    Impression and Recommendations:   GAD -overall she is doing better.  She is now been back on Paxil 10 mg for about 6 weeks.  Will send over prescription for the 10 mg and that way she does not have to split the 20s.  She does feel like her symptoms magnify around her menstrual cycle so she can always go up to 20 tabs during her menstrual week and give that a try to see if it helps or makes a difference.  Plus we could always increase her dose to 20 mg daily if needed and she is welcome to reach out any point if she feels like she would like to change that.  Right now she has had some improvement.  I think she is just a lot of this is some major life changes and I think the job change will be for the better so she can get more sleep.  Plan to follow-up in 6 months or sooner if she is having any problems or concerns.  GERD-did refill her Protonix.  Continue to use as needed instead of daily.       I discussed the assessment and treatment plan with the patient. The patient was provided an opportunity to ask questions and all were answered. The patient agreed with the plan and demonstrated an understanding of the instructions.   The patient was advised to call back or seek an in-person evaluation if the symptoms worsen or if the condition fails to improve as anticipated.   Beatrice Lecher, MD

## 2018-10-24 NOTE — Telephone Encounter (Signed)
Patient scheduled 6/29 with Dr. Stanford Breed

## 2018-11-11 ENCOUNTER — Encounter

## 2018-11-16 ENCOUNTER — Ambulatory Visit: Payer: 59 | Admitting: Family Medicine

## 2018-11-19 ENCOUNTER — Encounter: Payer: Self-pay | Admitting: Family Medicine

## 2018-11-21 MED ORDER — VALACYCLOVIR HCL 1 G PO TABS: 2000.0000 mg | ORAL_TABLET | Freq: Two times a day (BID) | ORAL | 3 refills | Status: AC

## 2018-11-21 MED FILL — valACYclovir HCL 1 GM TABS: 1 | 5 days supply | Qty: 20 | Fill #0

## 2018-11-29 ENCOUNTER — Telehealth: Payer: Self-pay | Admitting: *Deleted

## 2018-11-29 NOTE — Telephone Encounter (Signed)
Unable to reach pt regarding her FMLA forms. vm is full.Candice Robertson, [place]

## 2018-12-05 ENCOUNTER — Telehealth: Payer: Self-pay | Admitting: Cardiology

## 2018-12-07 ENCOUNTER — Telehealth: Payer: Self-pay | Admitting: Cardiology

## 2018-12-07 NOTE — Telephone Encounter (Signed)
called x3 for pre reg, sent message to my chart & email  °

## 2018-12-08 NOTE — Progress Notes (Deleted)
Virtual Visit via Video Note   This visit type was conducted due to national recommendations for restrictions regarding the COVID-19 Pandemic (e.g. social distancing) in an effort to limit this patient's exposure and mitigate transmission in our community.  Due to her co-morbid illnesses, this patient is at least at moderate risk for complications without adequate follow up.  This format is felt to be most appropriate for this patient at this time.  All issues noted in this document were discussed and addressed.  A limited physical exam was performed with this format.  Please refer to the patient's chart for her consent to telehealth for [facility].   Date:  12/08/2018   ID:  Candice Robertson, DOB 07-Jul-1985, MRN [id number]  Patient Location:Home Provider Location: Home  PCP:  [name], MD  Cardiologist:  Dr Stanford Breed  Evaluation Performed:  Consultation - Candice Robertson was referred by Lynne Leader MD for the evaluation of dizziness.  Chief Complaint:  Dizziness  History of Present Illness:    Candice Robertson is a 33 y.o. female with with past medical history of hypertension and general anxiety disorder for evaluation of dizziness.  Previously seen by Dr. Lovena Le but not since 2016.  Echocardiogram July 2016 showed normal LV function.  Previously felt to have autonomic dysfunction.  Recently seen for dizziness and electrocardiogram showed minimally prolonged QT interval.  Cardiology asked to evaluate.  The patient does not have symptoms concerning for COVID-19 infection (fever, chills, cough, or new shortness of breath).    Past Medical History:  Diagnosis Date  . Anxiety   . Anxiety disorder   . Breech presentation   . Dizziness   . GERD (gastroesophageal reflux disease)   . History of chicken pox   . History of gestational hypertension 10/14/2011  . Hypertension    With pregnancy only  . PP care - s/p 1C/S - breech 5/1 10/14/2011  . Tachycardia    with 2016  pregancy only   Past Surgical History:  Procedure Laterality Date  . CESAREAN SECTION  10/14/2011   Procedure: CESAREAN SECTION;  Surgeon: Lovenia Kim, MD;  Location: [facility];  Service: Gynecology;  Laterality: N/A;  Primary EDD: 10/21/11  . CESAREAN SECTION N/A 03/05/2015   Procedure: Repeat CESAREAN SECTION;  Surgeon: Brien Few, MD;  Location: [facility];  Service: Obstetrics;  Laterality: N/A;  EDD: 03/26/15   . TONSILECTOMY/ADENOIDECTOMY WITH MYRINGOTOMY  1991  . TONSILLECTOMY    . TYMPANOSTOMY TUBE PLACEMENT  1989     No outpatient medications have been marked as taking for the 12/12/18 encounter (Appointment) with Lelon Perla, MD.     Allergies:   Codeine phosphate and Zoloft [sertraline hcl]   Social History   Tobacco Use  . Smoking status: Former Smoker    Quit date: 10/07/2009    Years since quitting: 9.1  . Smokeless tobacco: Never Used  Substance Use Topics  . Alcohol use: Yes    Comment: socially but none with pregnancy  . Drug use: No     Family Hx: The patient's family history includes Alcohol abuse in her maternal uncle; Depression in her maternal grandmother; Diabetes in her maternal uncle; Heart attack in her maternal grandmother.  ROS:   Please see the history of present illness.    No Fever, chills  or productive cough All other systems reviewed and are negative.  Labs/Other Tests and Data Reviewed:    EKG:  An ECG dated 08/24/18 was personally  reviewed today and demonstrated:  Normal sinus rhythm, minimally prolonged QT interval.  Recent Labs: 08/24/2018: ALT 15; BUN 16; Creat 0.77; Hemoglobin 12.9; Magnesium 2.1; Platelets 224; Potassium 4.0; Sodium 139; TSH 1.23   Recent Lipid Panel Lab Results  Component Value Date/Time   CHOL 184 04/29/2017 08:20 AM   TRIG 60 04/29/2017 08:20 AM   HDL 62 04/29/2017 08:20 AM   CHOLHDL 3.0 04/29/2017 08:20 AM   LDLCALC 110 (H) 04/29/2017 08:20 AM   LDLDIRECT 107 (H) 04/29/2017 08:20 AM    Wt Readings  from Last 3 Encounters:  10/20/18 146 lb (66.2 kg)  10/19/18 146 lb (66.2 kg)  08/24/18 147 lb (66.7 kg)     Objective:    Vital Signs:  There were no vitals taken for this visit.   VITAL SIGNS:  reviewed NAD Answers questions appropriately Normal affect Remainder of physical examination not performed (telehealth visit; coronavirus pandemic)  ASSESSMENT & PLAN:    1. ***  COVID-19 Education: The importance of social distancing was discussed today.  Time:   Today, I have spent *** minutes with the patient with telehealth technology discussing the above problems.     Medication Adjustments/Labs and Tests Ordered: Current medicines are reviewed at length with the patient today.  Concerns regarding medicines are outlined above.   Tests Ordered: No orders of the defined types were placed in this encounter.   Medication Changes: No orders of the defined types were placed in this encounter.   Follow Up:  {F/U Format:[phone]} {follow up:15908}  Signed, Kirk Ruths, MD  12/08/2018 7:48 AM    [facility]

## 2018-12-09 NOTE — Telephone Encounter (Signed)
LVM for pt, reminding her of her appt on 12-12-18 with Dr Stanford Breed.

## 2018-12-12 ENCOUNTER — Telehealth: Payer: 59 | Admitting: Cardiology

## 2018-12-14 NOTE — Telephone Encounter (Signed)
Called patient 12/13/18 and 12/14/18 to reschedule appointment---voice mail if full and could not leave message

## 2019-01-05 ENCOUNTER — Other Ambulatory Visit: Payer: Self-pay | Admitting: Neurosurgery

## 2019-01-05 DIAGNOSIS — D3610 Benign neoplasm of peripheral nerves and autonomic nervous system, unspecified: Secondary | ICD-10-CM

## 2019-01-23 ENCOUNTER — Encounter: Payer: Self-pay | Admitting: Family Medicine

## 2019-01-23 NOTE — Telephone Encounter (Signed)
Please see Dr Gardiner Ramus note and call pt to get scheduled

## 2019-01-24 ENCOUNTER — Telehealth: Payer: Self-pay | Admitting: *Deleted

## 2019-01-24 NOTE — Telephone Encounter (Signed)
LV to call and reschedule her office visit with Dr.Crenshaw.

## 2019-01-25 ENCOUNTER — Telehealth: Payer: Self-pay | Admitting: Family Medicine

## 2019-01-25 NOTE — Telephone Encounter (Signed)
Sent in basket message to [employer] regarding referral for [facility].

## 2019-01-25 NOTE — Telephone Encounter (Signed)
An inpatient message was sent to Lynne Leader stating patient had been called 3 times and has not schedule an appt with out office.

## 2019-01-31 DIAGNOSIS — H52223 Regular astigmatism, bilateral: Secondary | ICD-10-CM | POA: Diagnosis not present

## 2019-02-02 IMAGING — MR MR LUMBAR SPINE WO/W CM
4 of 9 series · 19 of 48 positions shown · IV contrast (multihance)
Comparison: CT abdomen and pelvis 12/26/2016

CLINICAL DATA: Left presacral mass on CT. Numbness in the legs and
feet.

EXAM:
MRI LUMBAR SPINE WITHOUT AND WITH CONTRAST
TECHNIQUE: Multiplanar and multiecho pulse sequences of the lumbar spine were
obtained without and with intravenous contrast.
CONTRAST:  15mL MULTIHANCE GADOBENATE DIMEGLUMINE 529 MG/ML IV SOLN

[Series 3: T1 · sagittal · 4.0mm · 0.51mm/px · 4 of 14 slices shown (1 of 2)]
[im 1/14]
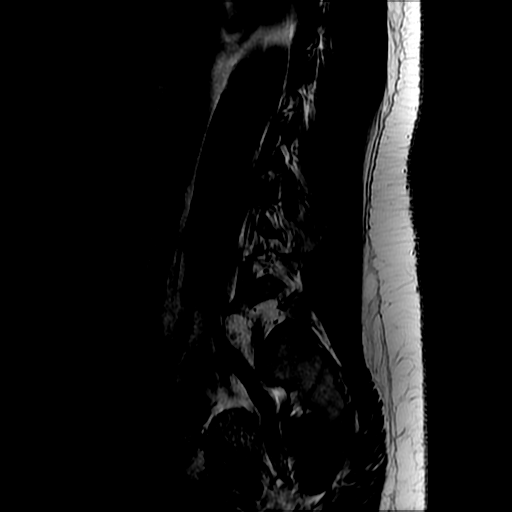
[im 5/14]
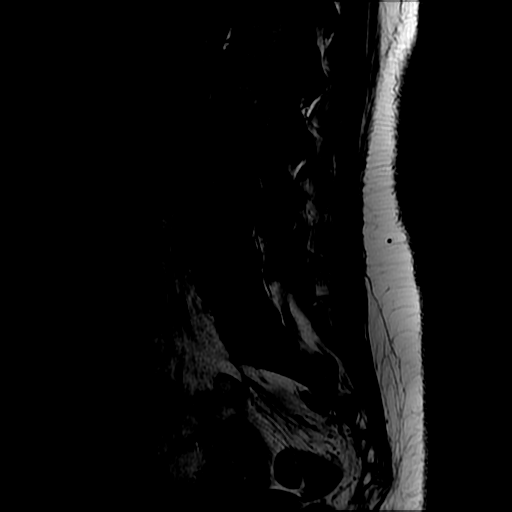
[im 9/14]
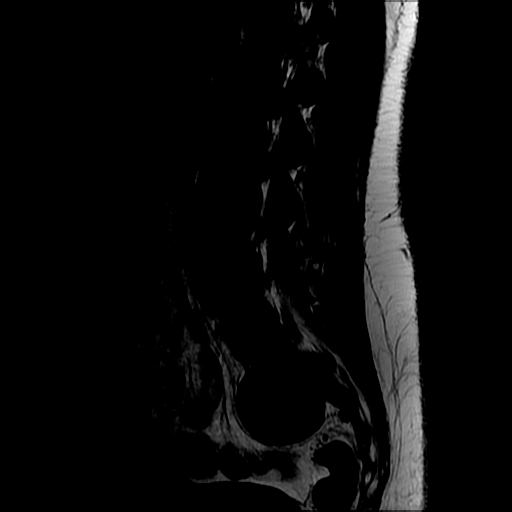
[im 14/14]
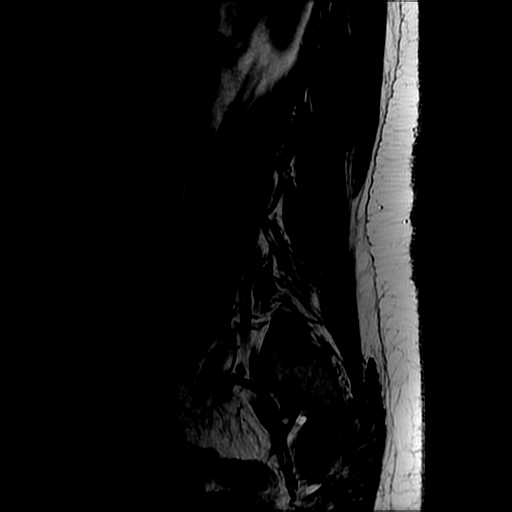

[Series 4: T2 · sagittal · 4.0mm · 0.51mm/px · 3 of 14 slices shown (1 of 2)]
[im 1/14]
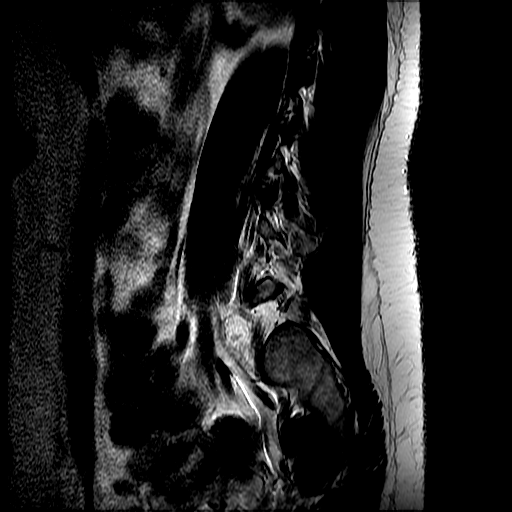
[im 7/14]
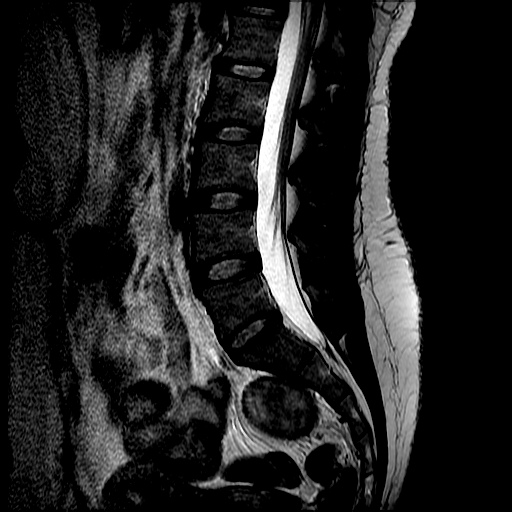
[im 14/14]
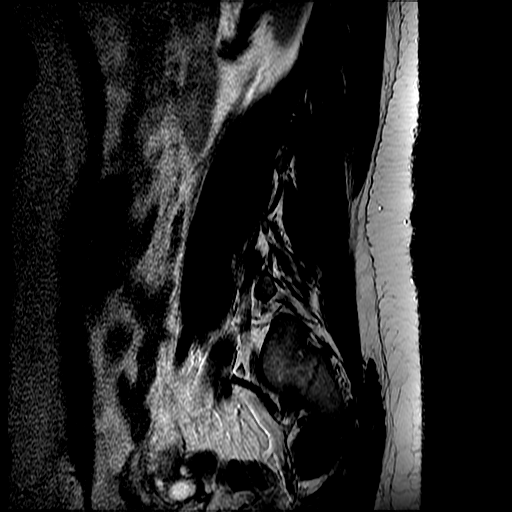

[Series 6: T2 · axial · 4.0mm · 0.39mm/px · z∈[-161,+47]mm · 9 of 37 slices shown (2 of 2)]
[im 1/37]
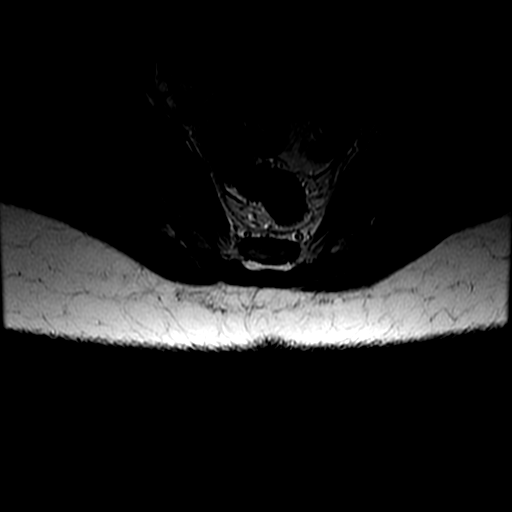
[im 5/37]
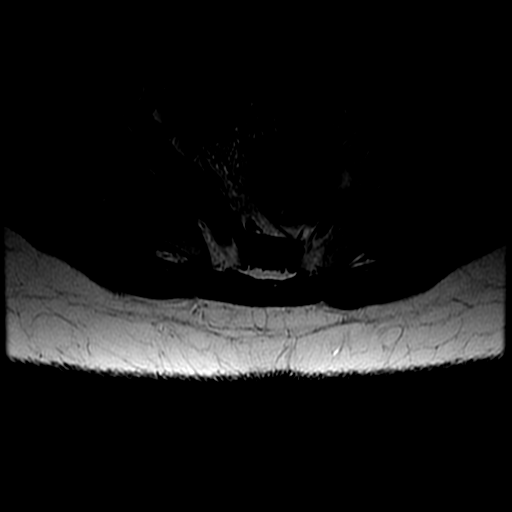
[im 10/37]
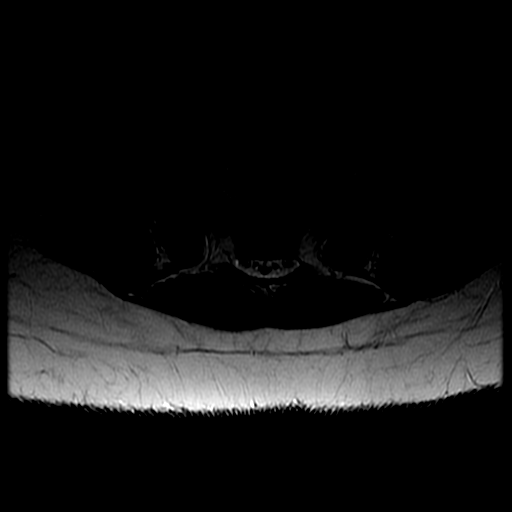
[im 14/37]
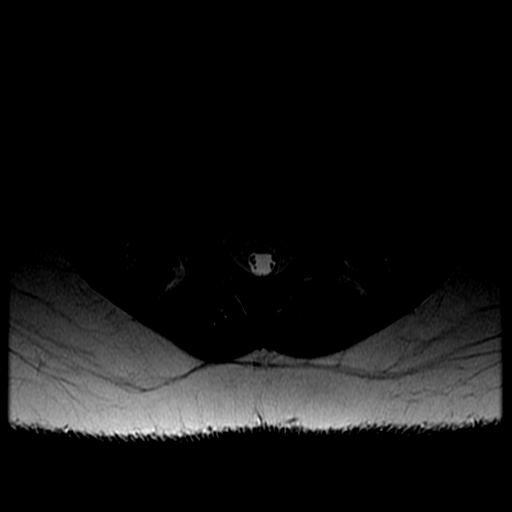
[im 19/37]
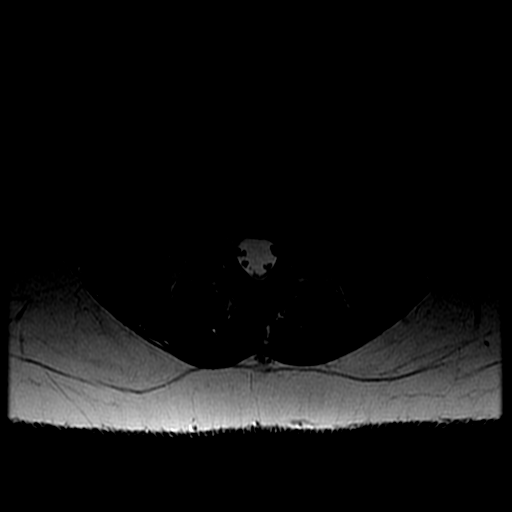
[im 23/37]
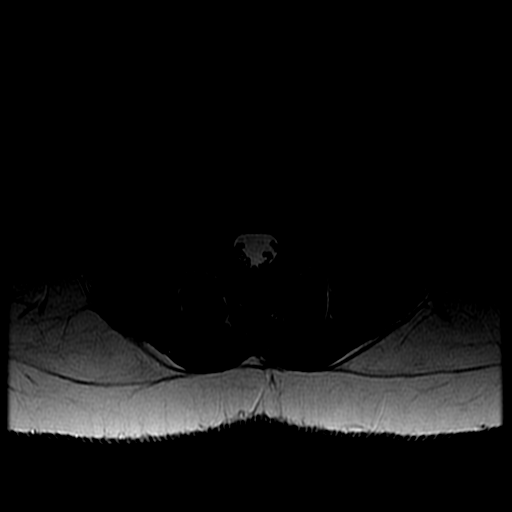
[im 28/37]
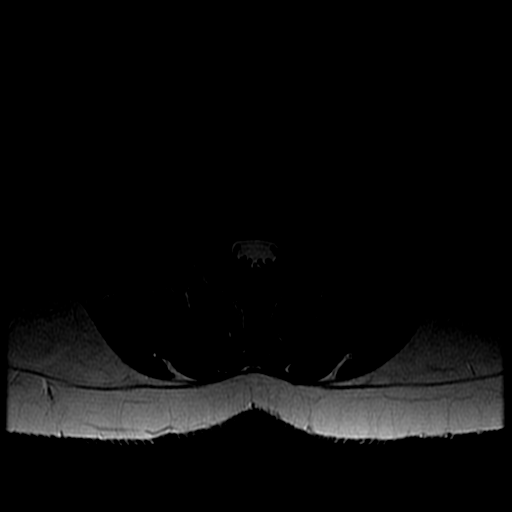
[im 32/37]
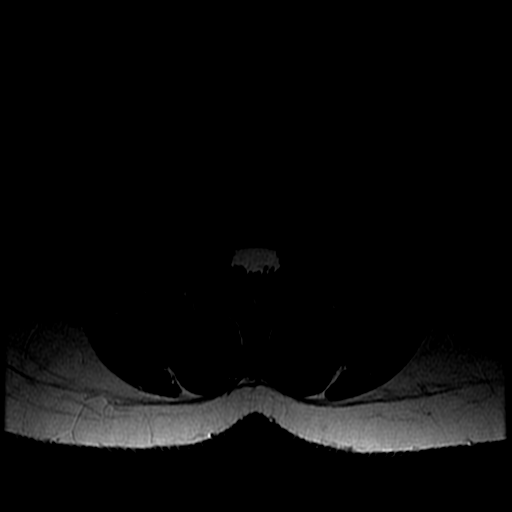
[im 37/37]
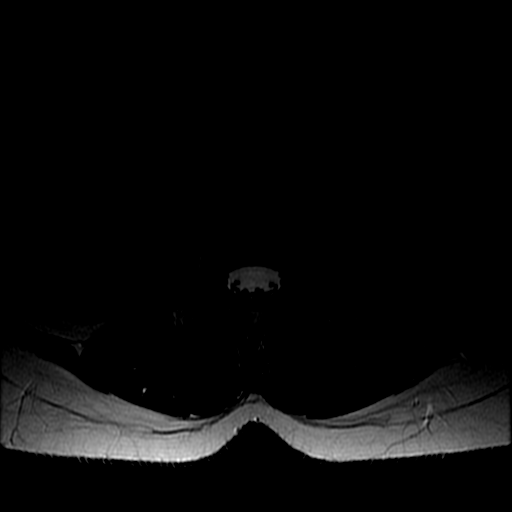

[Series 7: T1 · axial · 4.0mm · 0.39mm/px · z∈[-144,+23]mm · 3 of 37 slices shown (2 of 2)]
[im 5/37]
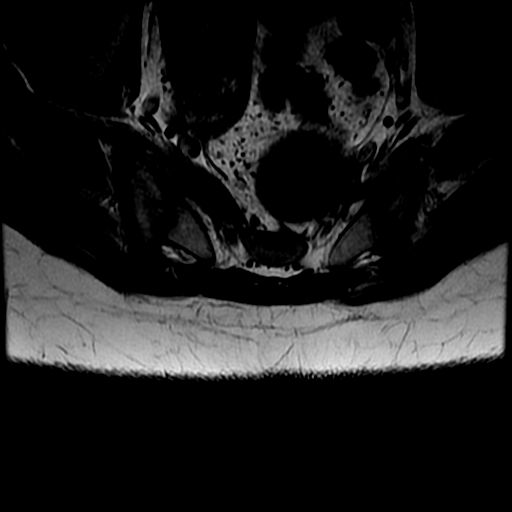
[im 19/37]
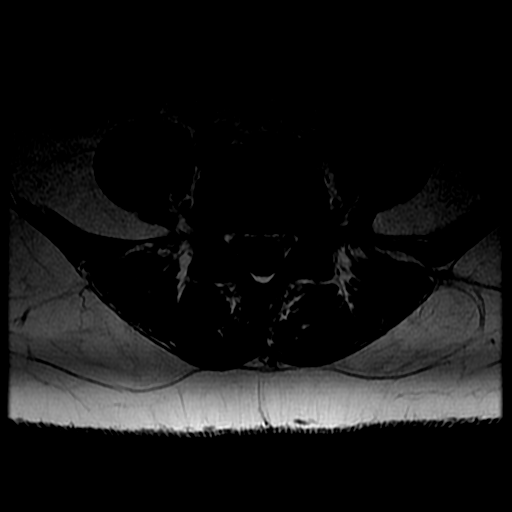
[im 32/37]
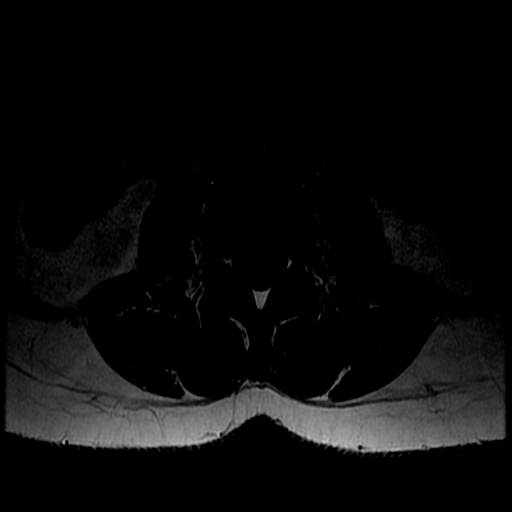

[19 of 48 positions shown; findings below may reference images not displayed]

FINDINGS: Segmentation:  Standard.

Alignment:  Normal.

Vertebrae:  No fracture, osseous lesion, or marrow edema.

Conus medullaris: Incompletely imaged though likely terminates at
T12-L1.

Paraspinal and other soft tissues: As seen on the recent CT, there
is a rounded left presacral mass which measures 4.3 x 4.3 x 4.2 cm.
This demonstrates homogeneous T1 hypointensity and heterogeneous T2
hyperintensity. The mass demonstrates largely avid enhancement with
a 1.5 cm area of hypoenhancement/cystic change anteriorly and only
mildly heterogeneous enhancement elsewhere. This mass is located
anterior to the left S1 neural foramen with the S1 nerve being
compressed along the posterolateral aspect of the mass. The mass
only slightly bulges into the anterior aspect of the left S1
foramen.

Disc levels:

Preserved disc height and hydration throughout the lumbar spine. No
disc herniation. Widely patent spinal canal and neural foramina.
IMPRESSION: 1. 4.3 cm presacral mass in close proximity to the left S1 nerve,
favored to represent a schwannoma.
2. Otherwise unremarkable appearance of the lumbar spine.

## 2019-02-06 ENCOUNTER — Other Ambulatory Visit: Payer: Self-pay

## 2019-02-06 ENCOUNTER — Ambulatory Visit (INDEPENDENT_AMBULATORY_CARE_PROVIDER_SITE_OTHER): Payer: 59

## 2019-02-06 DIAGNOSIS — D3610 Benign neoplasm of peripheral nerves and autonomic nervous system, unspecified: Secondary | ICD-10-CM

## 2019-02-06 MED ORDER — GADOBUTROL 1 MMOL/ML IV SOLN
6.0000 mL | Freq: Once | INTRAVENOUS | Status: AC | PRN
  Administered 2019-02-06: 6 mL via INTRAVENOUS

## 2019-02-09 DIAGNOSIS — Z01419 Encounter for gynecological examination (general) (routine) without abnormal findings: Secondary | ICD-10-CM | POA: Diagnosis not present

## 2019-02-09 DIAGNOSIS — Z6828 Body mass index (BMI) 28.0-28.9, adult: Secondary | ICD-10-CM | POA: Diagnosis not present

## 2019-02-09 DIAGNOSIS — Z1151 Encounter for screening for human papillomavirus (HPV): Secondary | ICD-10-CM | POA: Diagnosis not present

## 2019-04-20 DIAGNOSIS — E559 Vitamin D deficiency, unspecified: Secondary | ICD-10-CM | POA: Diagnosis not present

## 2019-04-20 DIAGNOSIS — E663 Overweight: Secondary | ICD-10-CM | POA: Diagnosis not present

## 2019-04-20 DIAGNOSIS — F411 Generalized anxiety disorder: Secondary | ICD-10-CM | POA: Diagnosis not present

## 2019-05-18 DIAGNOSIS — L631 Alopecia universalis: Secondary | ICD-10-CM | POA: Diagnosis not present

## 2019-08-08 ENCOUNTER — Ambulatory Visit: Payer: 59 | Attending: Internal Medicine

## 2019-08-08 DIAGNOSIS — Z20822 Contact with and (suspected) exposure to covid-19: Secondary | ICD-10-CM

## 2019-08-09 LAB — NOVEL CORONAVIRUS, NAA: SARS-CoV-2, NAA: NOT DETECTED

## 2019-08-22 ENCOUNTER — Encounter: Payer: Self-pay | Admitting: Family Medicine

## 2019-08-22 NOTE — Telephone Encounter (Signed)
FYI to PCP

## 2020-01-30 ENCOUNTER — Encounter: Payer: 59 | Admitting: Family Medicine

## 2020-02-21 DIAGNOSIS — H52223 Regular astigmatism, bilateral: Secondary | ICD-10-CM | POA: Diagnosis not present
